# Patient Record
Sex: Female | Born: 1995 | Race: Black or African American | Hispanic: No | Marital: Single | Smoking: Never smoker
Health system: Southern US, Community
[De-identification: ages and names within clinical notes are randomized; demographics above are authoritative.]

## PROBLEM LIST (undated history)

## (undated) DIAGNOSIS — F32A Depression, unspecified: Secondary | ICD-10-CM

## (undated) DIAGNOSIS — F329 Major depressive disorder, single episode, unspecified: Secondary | ICD-10-CM

## (undated) DIAGNOSIS — D649 Anemia, unspecified: Secondary | ICD-10-CM

## (undated) DIAGNOSIS — F419 Anxiety disorder, unspecified: Secondary | ICD-10-CM

## (undated) HISTORY — DX: Depression, unspecified: F32.A

## (undated) HISTORY — DX: Major depressive disorder, single episode, unspecified: F32.9

## (undated) HISTORY — DX: Anemia, unspecified: D64.9

## (undated) HISTORY — PX: NO PAST SURGERIES: SHX2092

## (undated) HISTORY — DX: Anxiety disorder, unspecified: F41.9

---

## 2016-05-19 ENCOUNTER — Emergency Department (HOSPITAL_COMMUNITY): Payer: Self-pay

## 2016-05-19 ENCOUNTER — Emergency Department (HOSPITAL_COMMUNITY)
Admission: EM | Admit: 2016-05-19 | Discharge: 2016-05-19 | Disposition: A | Discharge status: Home / Self Care | Payer: Self-pay | Attending: Emergency Medicine | Admitting: Emergency Medicine

## 2016-05-19 ENCOUNTER — Encounter (HOSPITAL_COMMUNITY): Payer: Self-pay

## 2016-05-19 DIAGNOSIS — R0789 Other chest pain: Secondary | ICD-10-CM | POA: Insufficient documentation

## 2016-05-19 DIAGNOSIS — R079 Chest pain, unspecified: Secondary | ICD-10-CM

## 2016-05-19 MED ORDER — IBUPROFEN 200 MG PO TABS
600.0000 mg | ORAL_TABLET | Freq: Once | ORAL | Status: AC
  Administered 2016-05-19: 600 mg via ORAL
  Filled 2016-05-19: qty 1

## 2016-05-19 NOTE — ED Triage Notes (Signed)
Patient complains of sharp intermittent chest pain x 2-3 months. States that her pain is worse with inspiration and cough, states cough for several days. Patient appears to splint chest with movement, denies trauma.

## 2016-05-19 NOTE — ED Provider Notes (Signed)
[facility]EMER[facility]ENCY DEPT Provider Note   CSN: [id number] Arrival date & time: 05/19/16  1431     History   Chief Complaint Chief Complaint  Patient presents with  . Chest Pain    HPI Ruth Simon is a 20 y.o. female.  Patient presents today with a chief complaint of chest pain.  She reports that the pain has been occurring intermittently over the past 2-3 months.  Pain typically lasts a few minutes and then resolves.  Pain located in the sternum and does not radiate.  She states that the pain is brought on by certain movements and also with emotional stress.  No association with exertion.  Pain worsens with coughing.  She has not taken anything for pain.  No prior cardiac history.  No FH of cardiac disease or sudden onset of death at a young age for an unknown cause.  She denies any SOB, nausea, vomiting, LE edema, hemoptysis, dizziness, or syncope.  No history of DVT or PE.  She denies exogenous estrogen use.  No prolonged travel or surgeries in the past month.      History reviewed. No pertinent past medical history.  There are no active problems to display for this patient.   History reviewed. No pertinent surgical history.  OB History    No data available       Home Medications    Prior to Admission medications   Not on File    Family History No family history on file.  Social History Social History  Substance Use Topics  . Smoking status: Never Smoker  . Smokeless tobacco: Never Used  . Alcohol use No     Allergies   Review of patient's allergies indicates no known allergies.   Review of Systems Review of Systems  All other systems reviewed and are negative.    Physical Exam Updated Vital Signs BP 119/74 (BP Location: Left Arm)   Pulse 91   Temp 98.4 F (36.9 C) (Oral)   Resp 18   Ht 5\' 5"  (1.651 m)   Wt 61.1 kg   LMP 05/12/2016   SpO2 100%   BMI 22.43 kg/m   Physical Exam  Constitutional: She appears well-developed and well-nourished. No  distress.  HENT:  Head: Normocephalic and atraumatic.  Mouth/Throat: Oropharynx is clear and moist.  Neck: Normal range of motion. Neck supple.  Cardiovascular: Normal rate, regular rhythm and normal heart sounds.   Pulmonary/Chest: Effort normal and breath sounds normal. She exhibits tenderness.  Abdominal: Soft. Bowel sounds are normal. She exhibits no distension and no mass. There is no tenderness. There is no rebound and no guarding. No hernia.  Musculoskeletal: Normal range of motion.  No LE edema or erythema  Skin: Skin is warm and dry. She is not diaphoretic.  Psychiatric: She has a normal mood and affect.  Nursing note and vitals reviewed.    ED Treatments / Results  Labs (all labs ordered are listed, but only abnormal results are displayed) Labs Reviewed - No data to display  EKG  EKG Interpretation  Date/Time:  Sunday May 19 2016 14:40:14 EDT Ventricular Rate:  99 PR Interval:  144 QRS Duration: 86 QT Interval:  346 QTC Calculation: 444 R Axis:   66 Text Interpretation:  Normal sinus rhythm Normal ECG Confirmed by Adriana SimasOOK  MD, BRIAN ([id number]) on 05/19/2016 4:36:31 PM       Radiology Dg Chest 2 View  Result Date: 05/19/2016 CLINICAL DATA:  Chest pain EXAM: CHEST  2 VIEW COMPARISON:  None. FINDINGS: The heart size and mediastinal contours are within normal limits. Both lungs are clear. The visualized skeletal structures are unremarkable. IMPRESSION: No active cardiopulmonary disease. Electronically Signed   By: Gerome Samavid  Williams III M.D   On: 05/19/2016 15:30    Procedures Procedures (including critical care time)  Medications Ordered in ED Medications - No data to display   Initial Impression / Assessment and Plan / ED Course  I have reviewed the triage vital signs and the nursing notes.  Pertinent labs & imaging results that were available during my care of the patient were reviewed by me and considered in my medical decision making (see chart for  details).  Clinical Course      Final Clinical Impressions(s) / ED Diagnoses   Final diagnoses:  None   Patient is an 20 year old female who presents today with a chief complaint of chest pain.  Pain is atypical.  Worse with movements and emotional stress.  Normal EKG.  CXR is negative.  PERC negative.  Feel that the patient is stable for discharge.  Return precautions given.    New Prescriptions New Prescriptions   No medications on file     Santiago GladHeather Kyna Blahnik, PA-C 05/22/16 1258    Donnetta HutchingBrian Cook, MD 05/22/16 1314

## 2018-08-12 ENCOUNTER — Emergency Department (HOSPITAL_COMMUNITY): Payer: Federal, State, Local not specified - PPO

## 2018-08-12 ENCOUNTER — Emergency Department (HOSPITAL_COMMUNITY)
Admission: EM | Admit: 2018-08-12 | Discharge: 2018-08-12 | Disposition: A | Discharge status: Home / Self Care | Payer: Federal, State, Local not specified - PPO | Attending: Emergency Medicine | Admitting: Emergency Medicine

## 2018-08-12 ENCOUNTER — Encounter (HOSPITAL_COMMUNITY): Payer: Self-pay | Admitting: Emergency Medicine

## 2018-08-12 DIAGNOSIS — R0602 Shortness of breath: Secondary | ICD-10-CM | POA: Diagnosis not present

## 2018-08-12 DIAGNOSIS — R079 Chest pain, unspecified: Secondary | ICD-10-CM

## 2018-08-12 DIAGNOSIS — R05 Cough: Secondary | ICD-10-CM | POA: Insufficient documentation

## 2018-08-12 DIAGNOSIS — R0789 Other chest pain: Secondary | ICD-10-CM | POA: Insufficient documentation

## 2018-08-12 DIAGNOSIS — Z79899 Other long term (current) drug therapy: Secondary | ICD-10-CM | POA: Insufficient documentation

## 2018-08-12 LAB — BASIC METABOLIC PANEL
ANION GAP: 4 — AB (ref 5–15)
BUN: 12 mg/dL (ref 6–20)
CO2: 25 mmol/L (ref 22–32)
Calcium: 9.1 mg/dL (ref 8.9–10.3)
Chloride: 111 mmol/L (ref 98–111)
Creatinine, Ser: 0.97 mg/dL (ref 0.44–1.00)
GFR calc Af Amer: >60 mL/min (ref >60)
GFR calc non Af Amer: >60 mL/min (ref >60)
GLUCOSE: 82 mg/dL (ref 70–99)
POTASSIUM: 3.7 mmol/L (ref 3.5–5.1)
Sodium: 140 mmol/L (ref 135–145)

## 2018-08-12 LAB — CBC
HEMATOCRIT: 42.4 % (ref 36.0–46.0)
HEMOGLOBIN: 13.6 g/dL (ref 12.0–15.0)
MCH: 30.1 pg (ref 26.0–34.0)
MCHC: 32.1 g/dL (ref 30.0–36.0)
MCV: 93.8 fL (ref 80.0–100.0)
Platelets: 216 10*3/uL (ref 150–400)
RBC: 4.52 MIL/uL (ref 3.87–5.11)
RDW: 12.9 % (ref 11.5–15.5)
WBC: 8.1 10*3/uL (ref 4.0–10.5)
nRBC: 0 % (ref 0.0–0.2)

## 2018-08-12 LAB — D-DIMER, QUANTITATIVE (NOT AT ARMC)

## 2018-08-12 NOTE — ED Provider Notes (Signed)
Medical screening examination/treatment/procedure(s) were conducted as a shared visit with non-physician practitioner(s) and myself.  I personally evaluated the patient during the encounter.  Clinical Impression:   Final diagnoses:  Nonspecific chest pain   CP since 2017. Pressure / sharp - lasts minutes and goes away.  She does not take oral contraceptives but does have the implantable Nexplanon.  No other risk factors for pulmonary embolism or coronary syndrome. Today the pain is more constant Under the L breast - radiates to mid chest - has been seen by cardiologist - stress test neg.  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  On exam has mild tachycardia - no other acute fidnigns - no edema or asymetry.   Eber HongMiller, Jaeden Westbay, MD 08/12/18 [phone]

## 2018-08-12 NOTE — ED Notes (Signed)
Patient transported to X-ray 

## 2018-08-12 NOTE — ED Triage Notes (Signed)
Pt to ED with c/o pain in left chest under left breast.  Pt st's walking up steps makes it worse

## 2018-08-12 NOTE — Discharge Instructions (Addendum)
You have been seen today for chest pain. Please read and follow all provided instructions.  ° °1. Medications: usual home medications °2. Treatment: rest, drink plenty of fluids °3. Follow Up: Please follow up with your primary doctor in 7 days for discussion of your diagnoses and further evaluation after today's visit; if you do not have a primary care doctor use the resource guide provided to find one; Please return to the ER for any new or worsening symptoms.  ° °Take medications as prescribed. Return to the emergency room for worsening condition or new concerning symptoms. Follow up with your regular doctor. If you don't have a regular doctor use one of the numbers below to establish a primary care doctor. ° ° °Emergency Department Resource Guide °1) Find a Doctor and Pay Out of Pocket °Although you won't have to find out who is covered by your insurance plan, it is a good idea to ask around and get recommendations. You will then need to call the office and see if the doctor you have chosen will accept you as a new patient and what types of options they offer for patients who are self-pay. Some doctors offer discounts or will set up payment plans for their patients who do not have insurance, but you will need to ask so you aren't surprised when you get to your appointment. ° °2) Contact Your Local Health Department °Not all health departments have doctors that can see patients for sick visits, but many do, so it is worth a call to see if yours does. If you don't know where your local health department is, you can check in your phone book. The CDC also has a tool to help you locate your state's health department, and many state websites also have listings of all of their local health departments. ° °3) Find a Walk-in Clinic °If your illness is not likely to be very severe or complicated, you may want to try a walk in clinic. These are popping up all over the country in pharmacies, drugstores, and shopping  centers. They're usually staffed by nurse practitioners or physician assistants that have been trained to treat common illnesses and complaints. They're usually fairly quick and inexpensive. However, if you have serious medical issues or chronic medical problems, these are probably not your best option. ° °No Primary Care Doctor: °Call [facility] at  832-8000 - they can help you locate a primary care doctor that  accepts your insurance, provides certain services, etc. °Physician Referral Service- 1-800-533-3463 ° °Emergency Department Resource Guide °1) Find a Doctor and Pay Out of Pocket °Although you won't have to find out who is covered by your insurance plan, it is a good idea to ask around and get recommendations. You will then need to call the office and see if the doctor you have chosen will accept you as a new patient and what types of options they offer for patients who are self-pay. Some doctors offer discounts or will set up payment plans for their patients who do not have insurance, but you will need to ask so you aren't surprised when you get to your appointment. ° °2) Contact Your Local Health Department °Not all health departments have doctors that can see patients for sick visits, but many do, so it is worth a call to see if yours does. If you don't know where your local health department is, you can check in your phone book. The CDC also has a tool to help you locate   your state's health department, and many state websites also have listings of all of their local health departments. ° °3) Find a Walk-in Clinic °If your illness is not likely to be very severe or complicated, you may want to try a walk in clinic. These are popping up all over the country in pharmacies, drugstores, and shopping centers. They're usually staffed by nurse practitioners or physician assistants that have been trained to treat common illnesses and complaints. They're usually fairly quick and inexpensive. However, if you  have serious medical issues or chronic medical problems, these are probably not your best option. ° °No Primary Care Doctor: °Call [facility] at  832-8000 - they can help you locate a primary care doctor that  accepts your insurance, provides certain services, etc. °Physician Referral Service- 1-800-533-3463 ° °Chronic Pain Problems: °Organization         Address  Phone   Notes  °[facility]  ([phone]) [phone] Patients need to be referred by their primary care doctor.  ° °Medication Assistance: °Organization         Address  Phone   Notes  °[place] [facility] [address] °[place], [place] [address] ([phone]) 641-8030 --Must be a resident of [place] °-- Must have NO insurance coverage whatsoever (no Medicaid/ Medicare, etc.) °-- The pt. MUST have a primary care doctor that directs their care regularly and follows them in the community °  °[facility]  (866) 331-1348   °[employer]  (888) 892-1162   ° °Agencies that provide inexpensive medical care: °Organization         Address  Phone   Notes  °[facility]  ([phone]) [phone]   °[facility]    (336) 832-7272   °[facility] [address] °[place], [place] [address] (336) 832-4777   °[facility] [address], °[place] (336) 271-4999   °[facility]    (336) 373-0678   °[facility]    (336) 272-1050   °[facility] ° [address], [place] Phone:  (336) 832-4444, Fax:  (336) [phone] Hours of Operation:  9 am - 6 pm, M-F.  Also accepts Medicaid/Medicare and self-pay.  °[facility] ° [address], [place] Phone: (336) [phone], Fax: (336) [phone]. Hours of Operation:  8:30 am - 5:30 pm, M-F.  Also accepts Medicaid and self-pay.  °[facility] [place] [address], [place] Phone: (336) [phone]   °[facility] [address],  [place], [place] (336)723-1848, Ext. 123 Mondays & Thursdays: 7-9 AM.  First 15 patients are seen on a first come, first serve basis. °  ° °Medicaid-accepting [place] Providers: ° °Organization         Address  Phone   Notes  °[facility] [address], [place] ([phone]) [phone] Also accepts self-pay patients.  °[facility] [address], [place] ° (336) [phone]   °[facility] [address], [place] (336) [phone]   °[facility] [address] [place] [address], [place] (336) [phone]   °[facility] [address],   ° (336) [phone] Only accepts [place] Access Medicaid patients after they have their name applied to their card.  ° °Self-Pay (no insurance) in [place]: ° °Organization           Address  Phone   Notes  °Sickle Cell Patients, [facility] [address], [place] (336) [phone]   °[facility] [address], [place] (336) [phone]   °[facility] ° [address], [place] (336) [phone]   °[facility]/Dr. Osei-Bonsu ° [address] [place] [address], [place] or [address], [place] ([phone]) [phone] Phone number for both [place] and [place] locations is the same.  °[facility] [address], [place] (336) [phone]   °[facility] [address] [place] [address], [place] or [address] (336) [phone] °(336) [phone]   °[facility] [address],  (336) 350-1642, phone; (336) [phone], fax Sees patients 1st and 3rd Saturday of every month.  Must not qualify for public or private insurance (i.e. Medicaid, Medicare,  Health Choice, Veterans' Benefits)  Household income should be no more than 200% of the poverty level The clinic cannot treat you if you are pregnant or think you are pregnant  Sexually  transmitted diseases are not treated at the clinic.  °  ° °

## 2018-08-12 NOTE — ED Notes (Signed)
Discharge instructions discussed with Pt. Pt verbalized understanding. Pt stable and ambulatory.    

## 2018-08-12 NOTE — ED Provider Notes (Signed)
[facility]   CSN: [id number] Arrival date & time: 08/12/18  2007     History   Chief Complaint Chief Complaint  Patient presents with  . Chest Pain    HPI Ruth Simon is a 22 y.o. female presenting with chest pain onset today at 5pm. Patient reports she woke up from a nap with the chest pain. Patient describes pain as a constant sharp cramp and states walking and moving makes the pain worse. Patient states the location of the pain is under her left breast and travels to the middle of her chest and her right shoulder. Patient states she has had this pain since 2017, but states it has worsened in the last 6 months. Patient presents today because chest pain is typically intermittent and last a few minutes, but the pain has been constant today. Patient denies taking any medications today, but states she typically takes ibuprofen with partial relief. Patient has been evaluated by a cardiologist and has had a negative stress test. Patient denies knowing the name or location of the cardiologist. Patient reports she is supposed to have a CTA tomorrow to rule out a blood clot. Patient denies contraceptive use, leg swelling/pain, recent surgery, prolonged travel, or being sedentary. Patient reports she is adopted and does not know her family's medical history. Patient reports she feels associated shortness of breath and coughing with the chest pain. Patient denies recent trauma, fever, chills, night sweats, hemoptysis, or syncope. Patient denies drug use. Patient was seen on 04/2016 with similar symptoms.   HPI  History reviewed. No pertinent past medical history.  There are no active problems to display for this patient.   History reviewed. No pertinent surgical history.   OB History   None      Home Medications    Prior to Admission medications   Medication Sig Start Date End Date Taking? Authorizing Provider  citalopram (CELEXA) 20  MG tablet Take 20 mg by mouth daily.   Yes [provider]    Family History History reviewed. No pertinent family history.  Social History Social History   Tobacco Use  . Smoking status: Never Smoker  . Smokeless tobacco: Never Used  Substance Use Topics  . Alcohol use: No  . Drug use: Never     Allergies   Patient has no known allergies.   Review of Systems Review of Systems  Constitutional: Negative for activity change, appetite change, chills, diaphoresis, fatigue, fever and unexpected weight change.  HENT: Negative for congestion and rhinorrhea.   Respiratory: Positive for cough and shortness of breath. Negative for chest tightness and wheezing.   Cardiovascular: Positive for chest pain. Negative for palpitations and leg swelling.  Gastrointestinal: Negative for abdominal pain, nausea and vomiting.  Endocrine: Negative for cold intolerance and heat intolerance.  Musculoskeletal: Negative for back pain.  Skin: Negative for rash.  Allergic/Immunologic: Negative for immunocompromised state.  Neurological: Negative for dizziness, syncope, weakness and light-headedness.  Psychiatric/Behavioral: Negative for agitation. The patient is not nervous/anxious.      Physical Exam Updated Vital Signs BP 125/70   Pulse 91   Temp 98 F (36.7 C) (Oral)   Resp 17   Ht 5\' 7"  (1.702 m)   Wt 69.9 kg   LMP 07/12/2018   SpO2 100%   BMI 24.12 kg/m   Physical Exam  Constitutional: She appears well-developed and well-nourished. No distress.  HENT:  Head: Normocephalic and atraumatic.  Neck: Normal range of motion.  Neck supple. No JVD present.  Cardiovascular: Normal rate, regular rhythm, normal heart sounds and normal pulses. Exam reveals no gallop and no friction rub.  No murmur heard. Pulses:      Radial pulses are 2+ on the right side, and 2+ on the left side.       Dorsalis pedis pulses are 2+ on the right side, and 2+ on the left side.  Pulmonary/Chest: Effort  normal and breath sounds normal. No respiratory distress. She has no wheezes. She has no rales. She exhibits no tenderness.  Patient has tenderness upon palpation of middle of chest and under her left breast. No rashes, bruising, or other skin changes noted.   Abdominal: Soft. There is no tenderness.  Musculoskeletal: Normal range of motion.  Neurological: She is alert.  Skin: Capillary refill takes less than 2 seconds. No rash noted. She is not diaphoretic. No pallor.  Psychiatric: She has a normal mood and affect.  Nursing note and vitals reviewed.    ED Treatments / Results  Labs (all labs ordered are listed, but only abnormal results are displayed) Labs Reviewed  BASIC METABOLIC PANEL - Abnormal; Notable for the following components:      Result Value   Anion gap 4 (*)    All other components within normal limits  D-DIMER, QUANTITATIVE (NOT AT [facility])  CBC    EKG None  Radiology Dg Chest 2 View  Result Date: 08/12/2018 CLINICAL DATA:  Acute onset left-sided chest pain and shortness of breath today. EXAM: CHEST - 2 VIEW COMPARISON:  05/19/2016 FINDINGS: The heart size and mediastinal contours are within normal limits. Both lungs are clear. The visualized skeletal structures are unremarkable. IMPRESSION: Negative.  No active cardiopulmonary disease. Electronically Signed   By: Myles Rosenthal M.D.   On: 08/12/2018 21:38    Procedures Procedures (including critical care time)  Medications Ordered in ED Medications - No data to display   Initial Impression / Assessment and Plan / ED Course  I have reviewed the triage vital signs and the nursing notes.  Pertinent labs & imaging results that were available during my care of the patient were reviewed by me and considered in my medical decision making (see chart for details).  Clinical Course as of Aug 12 2300  Wed Aug 12, 2018  2246 Basic metabolic panel(!) [AH]  2247 D-dimer, quantitative [AH]  2247 CBC and BMP are  unremarkable.   CBC [AH]  2247 D dimer is negative.   D-dimer, quantitative [AH]    Clinical Course User Index [AH] Leretha Dykes, PA-C    Suspect symptoms may be due nonspecific chest pain. Ordered D dimer, CBC, and CMP to assess symptoms and rule out a PE. Patient does not appear to have risk factors for a PE. Advised patient to continue to follow up with cardiologist. Patient is to be discharged with recommendation to follow up with PCP in regards to today's hospital visit. Chest pain is not likely of cardiac or pulmonary etiology d/t presentation, VSS, no tracheal deviation, no JVD or new murmur, RRR, breath sounds equal bilaterally, EKG without acute abnormalities, and negative CXR. Pt has been advised to return to the ED if CP becomes exertional, associated with diaphoresis or nausea, radiates to left jaw/arm, worsens or becomes concerning in any way. Pt appears reliable for follow up and is agreeable to discharge.   Case has been discussed with and seen by Dr. Hyacinth Meeker who agrees with the above plan to discharge.  Final Clinical Impressions(s) / ED Diagnoses   Final diagnoses:  Nonspecific chest pain    ED Discharge Orders    None       Leretha Dykes, PA-C 08/12/18 2301    Eber Hong, MD 08/12/18 2314

## 2018-10-29 ENCOUNTER — Other Ambulatory Visit: Payer: Self-pay

## 2018-10-29 ENCOUNTER — Encounter: Payer: Self-pay | Admitting: Internal Medicine

## 2018-10-29 ENCOUNTER — Ambulatory Visit (INDEPENDENT_AMBULATORY_CARE_PROVIDER_SITE_OTHER): Payer: Federal, State, Local not specified - PPO | Admitting: Internal Medicine

## 2018-10-29 VITALS — BP 118/60 | HR 87 | Temp 98.0°F | Ht 65.4 in | Wt 159.4 lb

## 2018-10-29 DIAGNOSIS — R0789 Other chest pain: Secondary | ICD-10-CM

## 2018-10-29 DIAGNOSIS — F32A Anxiety disorder, unspecified: Secondary | ICD-10-CM | POA: Insufficient documentation

## 2018-10-29 DIAGNOSIS — R002 Palpitations: Secondary | ICD-10-CM | POA: Insufficient documentation

## 2018-10-29 DIAGNOSIS — R55 Syncope and collapse: Secondary | ICD-10-CM

## 2018-10-29 DIAGNOSIS — F419 Anxiety disorder, unspecified: Secondary | ICD-10-CM | POA: Diagnosis not present

## 2018-10-29 DIAGNOSIS — M940 Chondrocostal junction syndrome [Tietze]: Secondary | ICD-10-CM

## 2018-10-29 DIAGNOSIS — F3289 Other specified depressive episodes: Secondary | ICD-10-CM

## 2018-10-29 DIAGNOSIS — F329 Major depressive disorder, single episode, unspecified: Secondary | ICD-10-CM | POA: Insufficient documentation

## 2018-10-29 DIAGNOSIS — R5383 Other fatigue: Secondary | ICD-10-CM | POA: Diagnosis not present

## 2018-10-29 NOTE — Progress Notes (Signed)
Subjective:     Patient ID: Ruth Simon , female    DOB: 01/20/1996 , 23 y.o.   MRN: [id number]   Chief Complaint  Patient presents with  . New Patient (Initial Visit)  . Chest Pain    HPI  Pt is here to establish care with our practice. Does not have any chronic conditions. She has been in the ER  In November for recurrent chest pain sent there by her Cardiologist. She does not think is associated with eating.  There she was not given a trial of GI cocktail She has been having a different type of chest pain in the past than recent,  when she was in high school and Freshman year of college. CP is described as sharp and would feel like her heart "folded" and her heart would start racing and she would get SOB. This episode last 2-3 minutes, but when she went to ER lasted 10 minutes. After this episodes she has episodes of dry cough which could last up to 5 min after. Denies rhinitis or PND during those time unless she was crying for the pain. She has also had sharp pains in her chest) under L breast or L upper chest) when she is not anxious.   Three weeks ago while at her office she stood up and felt light headed and fainted, her boss found her " like she was sleeping on the floor" and pt does not recall this part, and pt recalls hearing her voice who was waking her up asking her if she was sleepy, and she sent her home. Pt did not hit her head and did not seek care since then. She really did think much about it.  LMP- 12/24-31, is sexually active and also has partner use condoms.   PMhx- depression/ anxiety   Family History  Adopted: Yes  Problem Relation Age of Onset  . Hypertension Mother      Current Outpatient Medications:  .  citalopram (CELEXA) 20 MG tablet, Take 20 mg by mouth daily., Disp: , Rfl:    No Known Allergies   Review of Systems  Constitutional: Positive for appetite change. Negative for unexpected weight change.       She is eating less often but eats more  amounts, and has not been feeling hungry so has to remind herself to eat  Eyes: Negative for visual disturbance.  Respiratory: Negative for cough and shortness of breath.   Cardiovascular: Positive for chest pain. Negative for palpitations and leg swelling.       Onset of Pressure on L upper chest which lasted 5 min and was mild. Then 4 days ago came back but was more intese and resolved, and the next 2 days had it again. Tuesday lasted 5 min and would occur intermittently. Then yesterday was there all day and was constant pressure plus had lower abd cramping pain from menses.   Gastrointestinal: Positive for abdominal pain. Negative for constipation, diarrhea, nausea and vomiting.       Has nausea yesterday  Endocrine: Positive for polydipsia. Negative for cold intolerance, heat intolerance and polyphagia.       Feels hot a lot even when her fan is on  Genitourinary: Negative for dysuria, frequency, hematuria and urgency.  Musculoskeletal: Negative.   Skin: Negative for rash.       Has noticed increased hair loss, and denies dry skin more than her usual during the [place].   Allergic/Immunologic: Negative for environmental allergies and food allergies.  Neurological: Negative for dizziness, light-headedness, numbness and headaches.  Psychiatric/Behavioral: Positive for sleep disturbance.       Gets 5-6 h, and when she wakes up sometimes after 2-3 h cant fall back asleep     Today's Vitals   10/29/18 0855  BP: 118/60  Pulse: 87  Temp: 98 F (36.7 C)  TempSrc: Oral  SpO2: 97%  Weight: 159 lb 6.4 oz (72.3 kg)  Height: 5' 5.4" (1.661 m)   Body mass index is 26.2 kg/m.   Objective:  Physical Exam  BP 118/60 (BP Location: Left Arm, Patient Position: Sitting, Cuff Size: Normal)   Pulse 87   Temp 98 F (36.7 C) (Oral)   Ht 5' 5.4" (1.661 m)   Wt 159 lb 6.4 oz (72.3 kg)   LMP  (LMP Unknown)   SpO2 97%   BMI 26.20 kg/m   General Appearance:    Alert, cooperative, no distress,  appears stated age  Head:    Normocephalic, without obvious abnormality, atraumatic  Eyes:    PERRL, conjunctiva/corneas clear, EOM's intact, fundi    benign, both eyes  Ears:    Normal TM's and external ear canals, both ears  Nose:   Nares normal, septum midline, mucosa normal, no drainage    or sinus tenderness  Throat:   Lips, mucosa, and tongue normal; teeth and gums normal  Neck:   Supple, symmetrical, trachea midline, no adenopathy;    thyroid:  no enlargement/tenderness/nodules.  Back:     Symmetric, no curvature, ROM normal, no CVA tenderness  Lungs:     Clear to auscultation bilaterally, respirations unlabored  Chest Wall:    Is tender on L upper chest and under L breast on rib region. No masses or crepitations noted.    Heart:    Regular rate and rhythm, S1 and S2 normal, no murmur, rub   or gallop     Abdomen:     Soft, non-tender, bowel sounds active all four quadrants,    no masses, no organomegaly        Extremities:   Extremities normal, atraumatic, no cyanosis or edema  Pulses:   2+ and symmetric all extremities  Skin:   Skin color, texture, turgor normal, no rashes or lesions  Lymph nodes:   Cervical, supraclavicular, and axillary nodes normal  Neurologic:   CNII-XII intact, normal strength, sensation and reflexes    Throughout. Normal Rhomberg, tandem gait, tip toe gait, heel and finger to nose.    Assessment And Plan:  1. Palpitations-intermittent associated with with chest pain. Advised to call her cardiologist and discuss current symptoms including syncope. I believe she needs a holter.  - Magnesium - TSH - T4, Free - Thyroid antibodies - Thyroid Peroxidase Antibody - CMP14 + Anion Gap  2. Other chest pain- chronic - CBC no Diff  3. Fatigue, unspecified type- chronic  4. Anxiety- chronic.May continue same meds, but if all labs are normal, may have to increase her Citalopram.   5. Other depression - stable with current med.   6. Costochondritis-  chronic. Advised to take Ibuprofen tid x 7 days.  7- Syncope- episodic. If all labs are normal and Holter is normal, and this re-occurs we will have to order a head scan.     FU next week for Fu of labs.   Gwenda Heiner RODRIGUEZ-SOUTHWORTH, PA-C

## 2018-10-30 LAB — CBC
HEMATOCRIT: 40.1 % (ref 34.0–46.6)
Hemoglobin: 13.1 g/dL (ref 11.1–15.9)
MCH: 30 pg (ref 26.6–33.0)
MCHC: 32.7 g/dL (ref 31.5–35.7)
MCV: 92 fL (ref 79–97)
PLATELETS: 233 10*3/uL (ref 150–450)
RBC: 4.37 x10E6/uL (ref 3.77–5.28)
RDW: 13.1 % (ref 11.7–15.4)
WBC: 6.1 10*3/uL (ref 3.4–10.8)

## 2018-10-30 LAB — CMP14 + ANION GAP
ALBUMIN: 3.8 g/dL — AB (ref 3.9–5.0)
ALK PHOS: 71 IU/L (ref 39–117)
ALT: 19 IU/L (ref 0–32)
AST: 19 IU/L (ref 0–40)
Albumin/Globulin Ratio: 1.2 (ref 1.2–2.2)
Anion Gap: 14 mmol/L (ref 10.0–18.0)
BILIRUBIN TOTAL: 0.3 mg/dL (ref 0.0–1.2)
BUN / CREAT RATIO: 7 — AB (ref 9–23)
BUN: 7 mg/dL (ref 6–20)
CHLORIDE: 102 mmol/L (ref 96–106)
CO2: 22 mmol/L (ref 20–29)
Calcium: 9.1 mg/dL (ref 8.7–10.2)
Creatinine, Ser: 0.96 mg/dL (ref 0.57–1.00)
GFR calc non Af Amer: 85 mL/min/{1.73_m2} (ref >59)
GFR, EST AFRICAN AMERICAN: 98 mL/min/{1.73_m2} (ref >59)
GLOBULIN, TOTAL: 3.1 g/dL (ref 1.5–4.5)
Glucose: 87 mg/dL (ref 65–99)
Potassium: 4.3 mmol/L (ref 3.5–5.2)
SODIUM: 138 mmol/L (ref 134–144)
TOTAL PROTEIN: 6.9 g/dL (ref 6.0–8.5)

## 2018-10-30 LAB — TSH: TSH: 1.51 u[IU]/mL (ref 0.450–4.500)

## 2018-10-30 LAB — MAGNESIUM: MAGNESIUM: 1.9 mg/dL (ref 1.6–2.3)

## 2018-10-30 LAB — THYROID ANTIBODIES: Thyroperoxidase Ab SerPl-aCnc: 13 IU/mL (ref 0–34)

## 2018-10-30 LAB — T4, FREE: Free T4: 1.08 ng/dL (ref 0.82–1.77)

## 2018-11-04 ENCOUNTER — Ambulatory Visit: Payer: Federal, State, Local not specified - PPO | Admitting: Internal Medicine

## 2020-07-04 IMAGING — DX DG CHEST 2V
2 series · 2 of 2 positions shown · non-contrast
Comparison: 05/19/2016

CLINICAL DATA: Acute onset left-sided chest pain and shortness of
breath today.

EXAM:
CHEST - 2 VIEW

[chest pa]
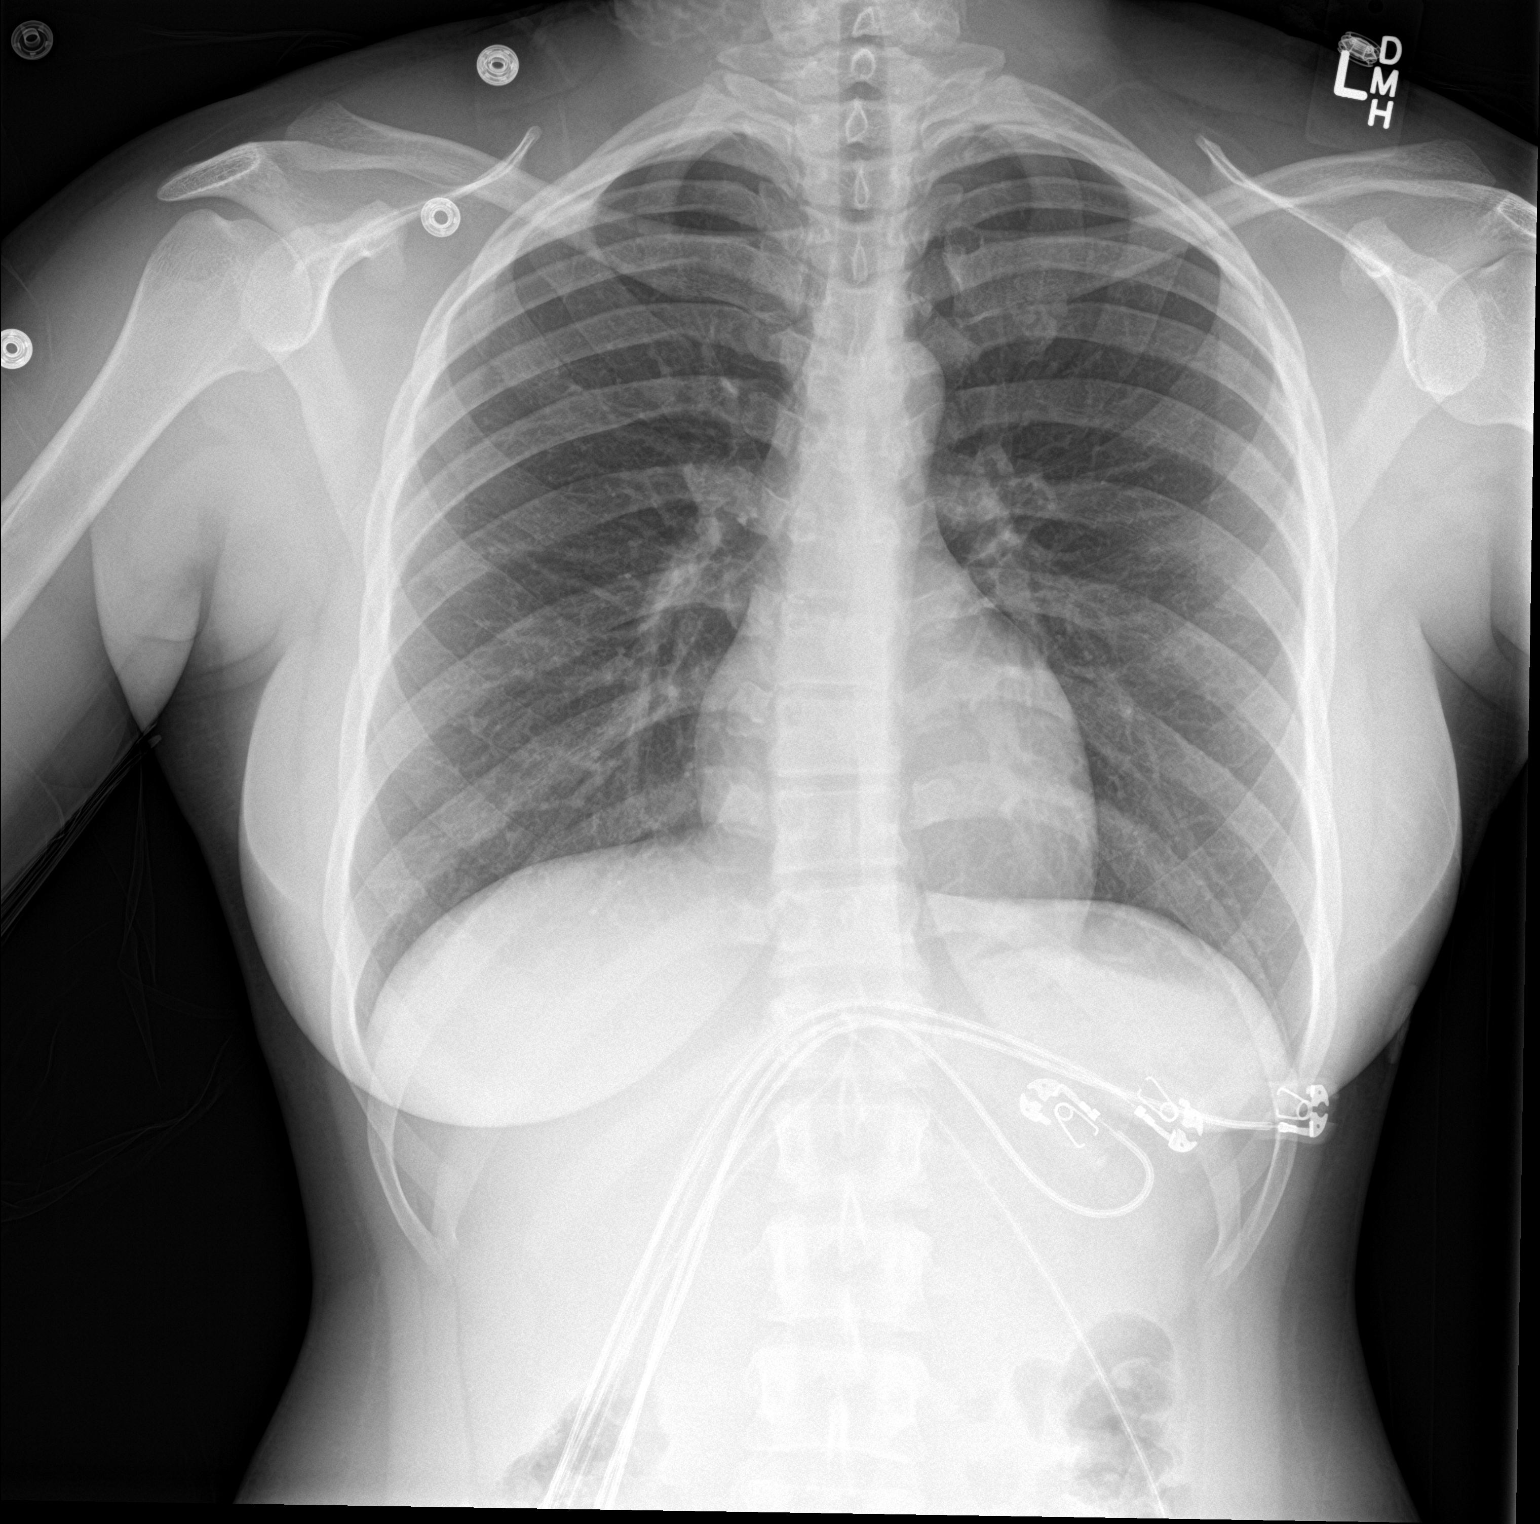

[chest lat]
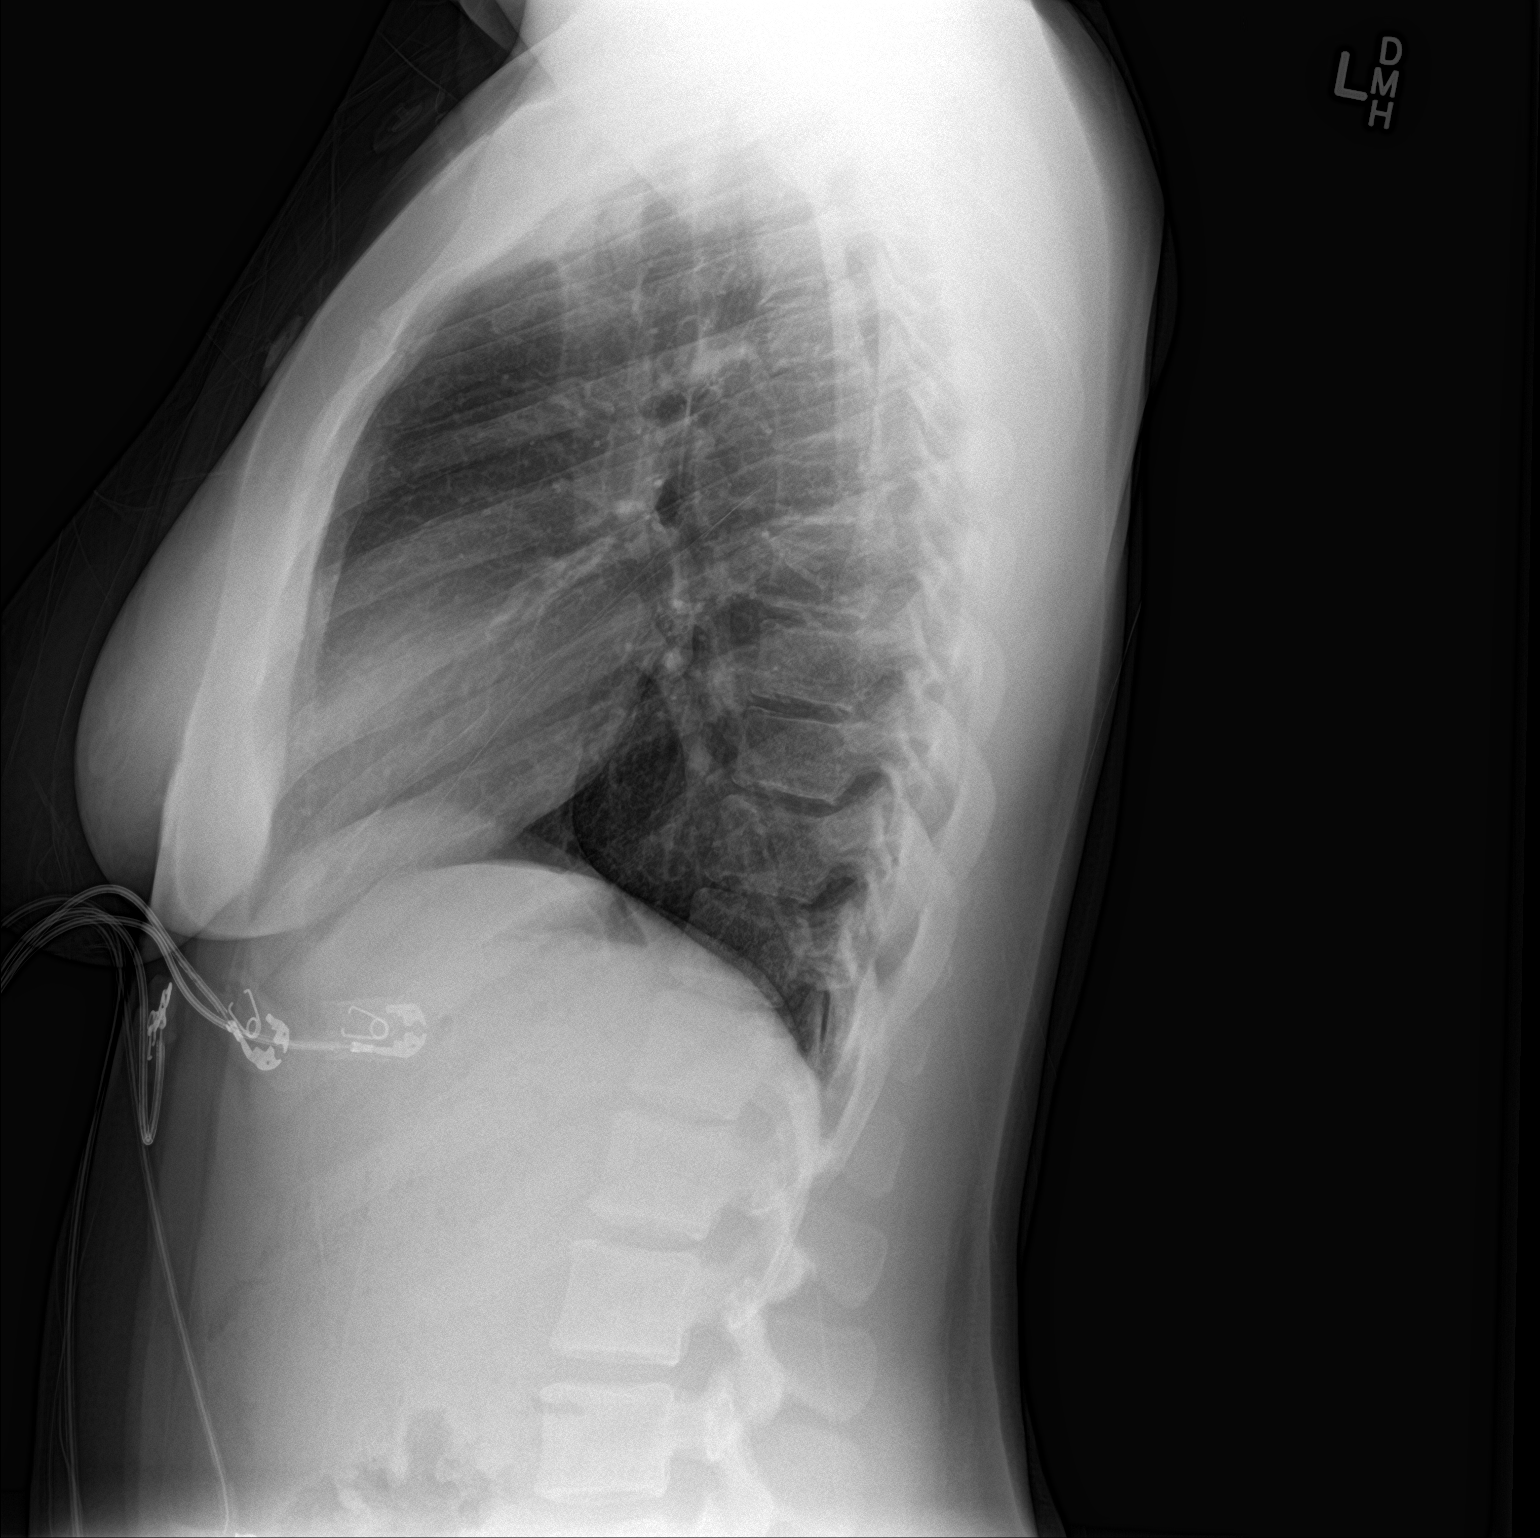

[2 of 2 positions shown; findings below may reference images not displayed]

FINDINGS: The heart size and mediastinal contours are within normal limits.
Both lungs are clear. The visualized skeletal structures are
unremarkable.
IMPRESSION: Negative.  No active cardiopulmonary disease.

## 2020-09-26 ENCOUNTER — Other Ambulatory Visit: Payer: Self-pay

## 2020-09-26 ENCOUNTER — Ambulatory Visit (HOSPITAL_COMMUNITY)
Admission: EM | Admit: 2020-09-26 | Discharge: 2020-09-26 | Disposition: A | Discharge status: Home / Self Care | Payer: Federal, State, Local not specified - PPO | Attending: Student | Admitting: Student

## 2020-09-26 ENCOUNTER — Encounter (HOSPITAL_COMMUNITY): Payer: Self-pay

## 2020-09-26 DIAGNOSIS — U071 COVID-19: Secondary | ICD-10-CM | POA: Insufficient documentation

## 2020-09-26 DIAGNOSIS — M940 Chondrocostal junction syndrome [Tietze]: Secondary | ICD-10-CM | POA: Diagnosis not present

## 2020-09-26 DIAGNOSIS — F419 Anxiety disorder, unspecified: Secondary | ICD-10-CM | POA: Insufficient documentation

## 2020-09-26 DIAGNOSIS — F32A Depression, unspecified: Secondary | ICD-10-CM | POA: Insufficient documentation

## 2020-09-26 DIAGNOSIS — Z79899 Other long term (current) drug therapy: Secondary | ICD-10-CM | POA: Diagnosis not present

## 2020-09-26 DIAGNOSIS — J069 Acute upper respiratory infection, unspecified: Secondary | ICD-10-CM

## 2020-09-26 DIAGNOSIS — R519 Headache, unspecified: Secondary | ICD-10-CM | POA: Diagnosis present

## 2020-09-26 MED ORDER — PROMETHAZINE-DM 6.25-15 MG/5ML PO SYRP: 5.0000 mL | ORAL_SOLUTION | Freq: Four times a day (QID) | ORAL | 0 refills | Status: DC | PRN

## 2020-09-26 MED ORDER — BENZONATATE 100 MG PO CAPS: 100.0000 mg | ORAL_CAPSULE | Freq: Three times a day (TID) | ORAL | 0 refills | Status: DC

## 2020-09-26 MED ORDER — ONDANSETRON 8 MG PO TBDP: 8.0000 mg | ORAL_TABLET | Freq: Three times a day (TID) | ORAL | 0 refills | Status: DC | PRN

## 2020-09-26 NOTE — ED Provider Notes (Signed)
[facility]    CSN: [id number] Arrival date & time: 09/26/20  1935      History   Chief Complaint Chief Complaint  Patient presents with  . Generalized Body Aches  . Emesis  . Headache    HPI Ruth Simon is a 24 y.o. female  presenting for body aches, sore throat, coughing, vomiting. History of anxiety, depression, palpitations, costochondritis. Today presents with 12 hours of symptoms. Has vomited twice today and is feeling naueous, but denies abd pain/ diarrhea. Is able to keep water down without issue but some difficulty with solid foods. Coughing frequently, occ productive. Denies fevers/chills, d, shortness of breath, chest pain, facial pain, teeth pain, headaches, sore throat, loss of taste/smell, swollen lymph nodes, ear pain. Fully vaccinated for covid19.   HPI  Past Medical History:  Diagnosis Date  . Anxiety   . Depression     Patient Active Problem List   Diagnosis Date Noted  . Anxiety 10/29/2018  . Other chest pain 10/29/2018  . Depression 10/29/2018  . Palpitations 10/29/2018  . Costochondritis 10/29/2018    History reviewed. No pertinent surgical history.  OB History   No obstetric history on file.      Home Medications    Prior to Admission medications   Medication Sig Start Date End Date Taking? Authorizing Provider  benzonatate (TESSALON) 100 MG capsule Take 1 capsule (100 mg total) by mouth every 8 (eight) hours. 09/26/20  Yes Rhys Martini, PA-C  ondansetron (ZOFRAN-ODT) 8 MG disintegrating tablet Take 1 tablet (8 mg total) by mouth every 8 (eight) hours as needed for nausea or vomiting. 09/26/20  Yes Rhys Martini, PA-C  promethazine-dextromethorphan (PROMETHAZINE-DM) 6.25-15 MG/5ML syrup Take 5 mLs by mouth 4 (four) times daily as needed for cough. 09/26/20  Yes Rhys Martini, PA-C  citalopram (CELEXA) 20 MG tablet Take 20 mg by mouth daily.    [provider]    Family History Family History  Adopted: Yes     Social History Social History   Tobacco Use  . Smoking status: Never Smoker  . Smokeless tobacco: Never Used  Vaping Use  . Vaping Use: Never used  Substance Use Topics  . Alcohol use: Yes    Comment: occasional  . Drug use: Never     Allergies   Patient has no known allergies.   Review of Systems Review of Systems  HENT: Positive for sore throat.   Respiratory: Positive for cough.   Gastrointestinal: Positive for nausea and vomiting.  All other systems reviewed and are negative.    Physical Exam Triage Vital Signs ED Triage Vitals  Enc Vitals Group     BP      Pulse      Resp      Temp      Temp src      SpO2      Weight      Height      Head Circumference      Peak Flow      Pain Score      Pain Loc      Pain Edu?      Excl. in GC?    No data found.  Updated Vital Signs BP 118/80 (BP Location: Left Arm)   Pulse (!) 109   Temp 99.2 F (37.3 C) (Oral)   Resp 20   LMP 09/08/2020   SpO2 100%   Visual Acuity Right Eye Distance:   Left Eye Distance:  Bilateral Distance:    Right Eye Near:   Left Eye Near:    Bilateral Near:     Physical Exam Vitals reviewed.  Constitutional:      General: She is not in acute distress.    Appearance: Normal appearance. She is not ill-appearing.  HENT:     Head: Normocephalic and atraumatic.     Right Ear: Hearing, tympanic membrane, ear canal and external ear normal. No swelling or tenderness. There is no impacted cerumen. No mastoid tenderness. Tympanic membrane is not perforated, erythematous, retracted or bulging.     Left Ear: Hearing, tympanic membrane, ear canal and external ear normal. No swelling or tenderness. There is no impacted cerumen. No mastoid tenderness. Tympanic membrane is not perforated, erythematous, retracted or bulging.     Nose:     Right Sinus: No maxillary sinus tenderness or frontal sinus tenderness.     Left Sinus: No maxillary sinus tenderness or frontal sinus tenderness.      Mouth/Throat:     Mouth: Mucous membranes are moist.     Pharynx: Uvula midline. No oropharyngeal exudate or posterior oropharyngeal erythema.     Tonsils: No tonsillar exudate.  Cardiovascular:     Rate and Rhythm: Normal rate and regular rhythm.     Heart sounds: Normal heart sounds.  Pulmonary:     Breath sounds: Normal breath sounds and air entry. No decreased breath sounds, wheezing, rhonchi or rales.     Comments: Coughing frequently Abdominal:     General: Abdomen is flat. Bowel sounds are normal.     Palpations: Abdomen is soft.     Tenderness: There is no abdominal tenderness. There is no right CVA tenderness, left CVA tenderness, guarding or rebound. Negative signs include Murphy's sign, Rovsing's sign and McBurney's sign.  Lymphadenopathy:     Cervical: No cervical adenopathy.  Neurological:     General: No focal deficit present.     Mental Status: She is alert and oriented to person, place, and time.  Psychiatric:        Attention and Perception: Attention and perception normal.        Mood and Affect: Mood and affect normal.        Behavior: Behavior is cooperative.      UC Treatments / Results  Labs (all labs ordered are listed, but only abnormal results are displayed) Labs Reviewed  RESP PANEL BY RT-PCR (FLU A&B, COVID) ARPGX2    EKG   Radiology No results found.  Procedures Procedures (including critical care time)  Medications Ordered in UC Medications - No data to display  Initial Impression / Assessment and Plan / UC Course  I have reviewed the triage vital signs and the nursing notes.  Pertinent labs & imaging results that were available during my care of the patient were reviewed by me and considered in my medical decision making (see chart for details).     Afebrile mildly tachy nontachypneic oxygenating well on room air.  Covid and influenza tests sent today. Patient is fully vaccinated for covid-19. Isolation precautions per CDC  guidelines until negative result. Symptomatic relief with OTC Mucinex, Nyquil, etc. Return precautions- new/worsening fevers/chills, shortness of breath, chest pain, abd pain, etc.   Symptomatic relief as below with tessalon, promethazine dm, zofran.   Final Clinical Impressions(s) / UC Diagnoses   Final diagnoses:  Acute upper respiratory infection     Discharge Instructions     -For your cough, use the tessalon and promethazine-dm cough syrup.  The cough syrup might make you drowsy, so take this at night.  -for your nausea, dissolve one pill of zofran in mouth as needed.  -drink plenty of fluids and eat a bland diet as tolerated.     ED Prescriptions    Medication Sig Dispense Auth. Provider   benzonatate (TESSALON) 100 MG capsule Take 1 capsule (100 mg total) by mouth every 8 (eight) hours. 21 capsule Rhys Martini, PA-C   promethazine-dextromethorphan (PROMETHAZINE-DM) 6.25-15 MG/5ML syrup Take 5 mLs by mouth 4 (four) times daily as needed for cough. 118 mL Ignacia Bayley E, PA-C   ondansetron (ZOFRAN-ODT) 8 MG disintegrating tablet Take 1 tablet (8 mg total) by mouth every 8 (eight) hours as needed for nausea or vomiting. 20 tablet Rhys Martini, PA-C     PDMP not reviewed this encounter.   Rhys Martini, PA-C 09/26/20 2100

## 2020-09-26 NOTE — Discharge Instructions (Addendum)
-  For your cough, use the tessalon and promethazine-dm cough syrup. The cough syrup might make you drowsy, so take this at night.  -for your nausea, dissolve one pill of zofran in mouth as needed.  -drink plenty of fluids and eat a bland diet as tolerated.

## 2020-09-26 NOTE — ED Triage Notes (Signed)
Pt presents with generalized body aches, vomiting, and headache since waking up this morning.

## 2020-09-27 LAB — RESP PANEL BY RT-PCR (FLU A&B, COVID) ARPGX2
Influenza A by PCR: NEGATIVE
Influenza B by PCR: NEGATIVE
SARS Coronavirus 2 by RT PCR: POSITIVE — AB

## 2020-10-27 ENCOUNTER — Ambulatory Visit: Payer: Federal, State, Local not specified - PPO | Admitting: Family

## 2020-11-24 ENCOUNTER — Ambulatory Visit (INDEPENDENT_AMBULATORY_CARE_PROVIDER_SITE_OTHER): Payer: BC Managed Care – PPO | Admitting: Family

## 2020-11-24 ENCOUNTER — Encounter: Payer: Self-pay | Admitting: Family

## 2020-11-24 ENCOUNTER — Other Ambulatory Visit: Payer: Self-pay

## 2020-11-24 VITALS — BP 113/74 | HR 75 | Ht 65.63 in | Wt 188.0 lb

## 2020-11-24 DIAGNOSIS — F32A Depression, unspecified: Secondary | ICD-10-CM

## 2020-11-24 DIAGNOSIS — Z7689 Persons encountering health services in other specified circumstances: Secondary | ICD-10-CM

## 2020-11-24 DIAGNOSIS — F419 Anxiety disorder, unspecified: Secondary | ICD-10-CM | POA: Diagnosis not present

## 2020-11-24 DIAGNOSIS — F909 Attention-deficit hyperactivity disorder, unspecified type: Secondary | ICD-10-CM

## 2020-11-24 MED ORDER — CITALOPRAM HYDROBROMIDE 20 MG PO TABS: 20.0000 mg | ORAL_TABLET | Freq: Every day | ORAL | 0 refills | Status: DC

## 2020-11-24 NOTE — Patient Instructions (Signed)
Return for annual physical examination, labs, and health maintenance. Arrive fasting meaning having had no food and/or nothing to drink for at least 8 hours prior to appointment.  Begin Celexa for anxiety and depression. Follow-up in 4 to 6 weeks.  Referral to [facility] [address] [place], [place] [address] Phone # [phone] Walk-Ins Welcome. Thank you for choosing Primary Care at [facility] for your medical home!    Ruth Simon was seen by Rema Fendt, NP today.   Fara Olden primary care provider is Amy Jodi Geralds, NP.   For the best care possible,  you should try to see Ricky Stabs, NP whenever you come to clinic.   We look forward to seeing you again soon!  If you have any questions about your visit today,  please call us at [phone]  Or feel free to reach your provider via MyChart.    Major Depressive Disorder, Adult Major depressive disorder is a mental health condition. This disorder affects feelings. It can also affect the body. Symptoms of this condition last most of the day, almost every day, for 2 weeks. This disorder can affect:  Relationships.  Daily activities, such as work and school.  Activities that you normally like to do. What are the causes? The cause of this condition is not known. The disorder is likely caused by a mix of things, including:  Your personality, such as being a shy person.  Your behavior, or how you act toward others.  Your thoughts and feelings.  Too much alcohol or drugs.  How you react to stress.  Health and mental problems that you have had for a long time.  Things that hurt you in the past (trauma).  Big changes in your life, such as divorce. What increases the risk? The following factors may make you more likely to develop this condition:  Having family members with depression.  Being a woman.  Problems in the family.  Low levels of some brain chemicals.  Things that  caused you pain as a child, especially if you lost a parent or were abused.  A lot of stress in your life, such as from: ? Living without basic needs of life, such as food and shelter. ? Being treated poorly because of race, sex, or religion (discrimination).  Health and mental problems that you have had for a long time. What are the signs or symptoms? The main symptoms of this condition are:  Being sad all the time.  Being grouchy all the time.  Loss of interest in things and activities. Other symptoms include:  Sleeping too much or too little.  Eating too much or too little.  Gaining or losing weight, without knowing why.  Feeling tired or having low energy.  Being restless and weak.  Feeling hopeless, worthless, or guilty.  Trouble thinking clearly or making decisions.  Thoughts of hurting yourself or others, or thoughts of ending your life.  Spending a lot of time alone.  Inability to complete common tasks of daily life. If you have very bad MDD, you may:  Believe things that are not true.  Hear, see, taste, or feel things that are not there.  Have mild depression that lasts for at least 2 years.  Feel very sad and hopeless.  Have trouble speaking or moving. How is this treated? This condition may be treated with:  Talk therapy. This teaches you to know bad thoughts, feelings, and actions and how to  change them. ? This can also help you to communicate with others. ? This can be done with members of your family.  Medicines. These can be used to treat worry (anxiety), depression, or low levels of chemicals in the brain.  Lifestyle changes. You may need to: ? Limit alcohol use. ? Limit drug use. ? Get regular exercise. ? Get plenty of sleep. ? Make healthy eating choices. ? Spend more time outdoors.  Brain stimulation. This treatment excites the brain. This is done when symptoms are very bad or have not gotten better with other treatments. Follow  these instructions at home: Activity  Get regular exercise as told.  Spend time outdoors as told.  Make time to do the things you enjoy.  Find ways to deal with stress. Try to: ? Meditate. ? Do deep breathing. ? Spend time in nature. ? Keep a journal.  Return to your normal activities as told by your doctor. Ask your doctor what activities are safe for you. Alcohol and drug use  If you drink alcohol: ? Limit how much you use to:  0-1 drink a day for women.  0-2 drinks a day for men. ? Be aware of how much alcohol is in your drink. In the [place] one drink equals one 12 oz bottle of beer (355 mL), one 5 oz glass of wine (148 mL), or one 1 oz glass of hard liquor (44 mL).  Talk to your doctor about: ? Alcohol use. Alcohol can affect some medicines. ? Any drug use. General instructions  Take over-the-counter and prescription medicines and herbal preparations only as told by your doctor.  Eat a healthy diet.  Get a lot of sleep.  Think about joining a support group. Your doctor may be able to suggest one.  Keep all follow-up visits as told by your doctor. This is important.   Where to find more information:  [employer] on Mental Illness: www.nami.org  [place] [employer] of Mental Health: http://www.maynard.net/  American Psychiatric Association: www.psychiatry.org/patients-families/ Contact a doctor if:  Your symptoms get worse.  You get new symptoms. Get help right away if:  You hurt yourself.  You have serious thoughts about hurting yourself or others.  You see, hear, taste, smell, or feel things that are not there. If you ever feel like you may hurt yourself or others, or have thoughts about taking your own life, get help right away. Go to your nearest emergency department or:  Call your local emergency services (911 in the [place]  Call a suicide crisis helpline, such as the National Suicide Prevention Lifeline at 778 850 0239. This is open 24 hours  a day in the [place]  Text the Crisis Text Line at (913)228-3519 (in the [place] Summary  Major depressive disorder is a mental health condition. This disorder affects feelings. Symptoms of this condition last most of the day, almost every day, for 2 weeks.  The symptoms of this disorder can cause problems with relationships and with daily activities.  There are treatments and support for people who get this disorder. You may need more than one type of treatment.  Get help right away if you have serious thoughts about hurting yourself or others. This information is not intended to replace advice given to you by your health care provider. Make sure you discuss any questions you have with your health care provider. Document Revised: 08/28/2019 Document Reviewed: 08/28/2019 Elsevier Patient Education  2021 [employer].

## 2020-11-24 NOTE — Progress Notes (Signed)
Subjective:    Ruth Simon - 25 y.o. female MRN [id number]  Date of birth: [birth year]  HPI  Ruth Simon is to establish care. Patient has a PMH significant for costochondritis, anxiety, chest pain, depression, and palpitations.   Current issues and/or concerns: 1. ANXIETY: Reports since sophomore year of college she was prescribed Celexa to assist with anxiety. Has been at least 2 to 3 years since last taken.attempted to call for refills but feels that her moving from [place], [place] to [place] may have contributed to the loss of prescription refills. Describes feeling anxious, easily irritated, and on edge. Denies thoughts of self-harm, suicidal ideations, and homicidal ideations.   Duration:uncontrolled Anxious mood: yes  Excessive worrying: yes Irritability: yes  Sweating: no Nausea: no Palpitations:no Hyperventilation: no Panic attacks: sometimes Depressed mood: yes Depression screen [facility] 2/9 11/24/2020 10/29/2018  Decreased Interest 2 0  Down, Depressed, Hopeless 3 1  PHQ - 2 Score 5 1  Altered sleeping 2 -  Tired, decreased energy 3 -  Change in appetite 3 -  Feeling bad or failure about yourself  3 -  Trouble concentrating 3 -  Moving slowly or fidgety/restless 3 -  Suicidal thoughts 2 -  PHQ-9 Score 24 -   Insomnia: yes  Fatigue/loss of energy: yes  Feelings of worthlessness: yes  Feelings of guilt: yes  Impaired concentration/indecisiveness: yes Suicidal ideations: no  Crying spells: sometimes  Recent Stressors/Life Changes: yes   Relationship problems: yes, denies domestic abuse    Family stress: Yes, says that she feels as if she is holding her family back from what they really want to do because they are worried about if she is going to be ok. Says she hears a voice inside of her head that she needs to start medication so that she will feel better.    Financial stress: no Yes    Job stress: no    Recent death/loss: no  2. ADHD: Requesting to begin  ADHD medication. Describes easily forgetting things, talking too much, amd brain constantly running. Says she was diagnosed during childhood and started on Ritalin. Says her mother discontinued the medication because she didn't seem like herself.   Current issues and/or concerns:  ROS per HPI   Health Maintenance:  Health Maintenance Due  Topic Date Due  . Hepatitis C Screening  Never done  . COVID-19 Vaccine (1) Never done  . HIV Screening  Never done  . TETANUS/TDAP  Never done  . PAP-Cervical Cytology Screening  Never done  . PAP SMEAR-Modifier  Never done   Past Medical History: Patient Active Problem List   Diagnosis Date Noted  . Anxiety 10/29/2018  . Other chest pain 10/29/2018  . Depression 10/29/2018  . Palpitations 10/29/2018  . Costochondritis 10/29/2018    Social History   reports that she has never smoked. She has never used smokeless tobacco. She reports previous alcohol use. She reports that she does not use drugs.   Family History  Family history is unknown by patient.   Medications: reviewed and updated   Objective:   Physical Exam BP 113/74 (BP Location: Left Arm, Patient Position: Sitting)   Pulse 75   Ht 5' 5.63" (1.667 m)   Wt 188 lb (85.3 kg)   SpO2 96%   BMI 30.69 kg/m  Physical Exam HENT:     Head: Normocephalic and atraumatic.  Eyes:     Extraocular Movements: Extraocular movements intact.     Pupils: Pupils are equal, round, and  reactive to light.  Cardiovascular:     Rate and Rhythm: Normal rate and regular rhythm.     Pulses: Normal pulses.     Heart sounds: Normal heart sounds.  Pulmonary:     Effort: Pulmonary effort is normal.     Breath sounds: Normal breath sounds.  Musculoskeletal:     Cervical back: Normal range of motion and neck supple.  Neurological:     General: No focal deficit present.     Mental Status: She is alert and oriented to person, place, and time.  Psychiatric:        Mood and Affect: Mood normal.         Behavior: Behavior normal.       Assessment & Plan:  1. Encounter to establish care: - Patient presents today to establish care.  - Return for annual physical examination, labs, and health maintenance. Arrive fasting meaning having had no food and/or nothing to drink for at least 8 hours prior to appointment.  2. Anxiety: 3. Depression, unspecified depression type: - Chronic. - Stable.  - Denies thoughts of self-harm, suicidal ideations, and homicidal ideations. - Begin Citalopram as prescribed. Avoid driving or hazardous activity until you know how this medication will affect you. Your reactions could be impaired. Dizziness or fainting can cause falls, accidents, or severe injuries.  Common side effects include drowsiness, nausea, constipation, loss of appetite, dry mouth, increased sweating.  Call your doctor if you have pounding heartbeats or fluttering in your chest, a light-headed feeling like you may pass out, easy bruising/unusal bleeding, vision change, difficult or painful urination, impotence/sexual problems, liver problems (right-sided upper stomach pain, itching, dark urine, yellowing of skin or eyes/jaundice, low levels of sodium in the body (headache, confusion, slurred speech, severe weakness, vomiting, loss of coordination, feeling unsteady), or manic episodes (racing thoughts, increased energy, decreased need for sleep, risk-taking behavior, being agitated, talkative)  Seek medical attention immediately if you have symptoms of serotonin syndrome such as agitation, hallucinations, fever, sweating, shivering, fast heart rate, muscle stiffness, twitching, loss of coordination, nausea, vomiting, or diarrhea  Report any new or worsening symptoms to your doctor, such as: mood or behavior changes, anxiety, panic attacks, trouble sleeping, or if you feel impulsive, irritable, agitated, hostile, aggressive, restless, hyperactive (mentally or physically), more depressed, or have  thoughts about suicide or hurting yourself - Referral to Psychiatry at the [facility] for further evaluation and management.  - Follow-up with primary provider in 4 weeks or sooner if needed.  - citalopram (CELEXA) 20 MG tablet; Take 1 tablet (20 mg total) by mouth daily.  Dispense: 30 tablet; Refill: 0   4. Attention deficit hyperactivity disorder (ADHD), unspecified ADHD type: - Requesting to begin ADHD medication. Describes easily forgetting things, talking too much, amd brain constantly running. Says she was diagnosed during childhood and started on Ritalin. Says her mother discontinued the medication because she didn't seem like herself. - Referral to Psychiatry at the [facility] for further evaluation and management.  - Ambulatory referral to Psychiatry   Patient was given clear instructions to go to Emergency Department or return to medical center if symptoms don't improve, worsen, or new problems develop.The patient verbalized understanding.  I discussed the assessment and treatment plan with the patient. The patient was provided an opportunity to ask questions and all were answered. The patient agreed with the plan and demonstrated an understanding of the instructions.   The patient was advised to  call back or seek an in-person evaluation if the symptoms worsen or if the condition fails to improve as anticipated.    Ricky Stabs, NP 11/25/2020, 1:08 PM Primary Care at [facility]

## 2020-11-24 NOTE — Progress Notes (Signed)
Establish care Wants to start back on Celexa Pt feels she has undiagnosed ADHD PHQ-9=24

## 2020-12-22 ENCOUNTER — Other Ambulatory Visit: Payer: Self-pay

## 2020-12-22 ENCOUNTER — Ambulatory Visit (INDEPENDENT_AMBULATORY_CARE_PROVIDER_SITE_OTHER): Payer: Federal, State, Local not specified - PPO | Admitting: Family

## 2020-12-22 ENCOUNTER — Encounter: Payer: Self-pay | Admitting: Family

## 2020-12-22 VITALS — BP 101/67 | HR 78 | Ht 65.63 in | Wt 183.6 lb

## 2020-12-22 DIAGNOSIS — F419 Anxiety disorder, unspecified: Secondary | ICD-10-CM

## 2020-12-22 DIAGNOSIS — F32A Depression, unspecified: Secondary | ICD-10-CM

## 2020-12-22 MED ORDER — CITALOPRAM HYDROBROMIDE 20 MG PO TABS: 20.0000 mg | ORAL_TABLET | Freq: Every day | ORAL | 2 refills | Status: DC

## 2020-12-22 NOTE — Progress Notes (Signed)
Patient ID: Ruth Simon, female    DOB: 1996/09/24  MRN: [id number]  CC: Anxiety and Depression Follow-Up  Subjective: Ruth Simon is a 25 y.o. female who presents for anxiety and depression. Her concerns today include:   1. ANXIETY AND DEPRESSION FOLLOW-UP: 11/24/2020: - Begin Citalopram as prescribed.  12/22/2020: - Today reports doing well on current regimen. Reports better mood and has more enjoyment at her job. Denies thoughts of self-harm, suicidal ideations, and homicidal ideations.    Patient Active Problem List   Diagnosis Date Noted  . Anxiety 10/29/2018  . Other chest pain 10/29/2018  . Depression 10/29/2018  . Palpitations 10/29/2018  . Costochondritis 10/29/2018     No current outpatient medications on file prior to visit.   No current facility-administered medications on file prior to visit.    No Known Allergies  Social History   Socioeconomic History  . Marital status: Single    Spouse name: Not on file  . Number of children: Not on file  . Years of education: Not on file  . Highest education level: Not on file  Occupational History  . Not on file  Tobacco Use  . Smoking status: Never Smoker  . Smokeless tobacco: Never Used  Vaping Use  . Vaping Use: Never used  Substance and Sexual Activity  . Alcohol use: Not Currently    Comment: occasional  . Drug use: Never  . Sexual activity: Yes    Birth control/protection: Implant  Other Topics Concern  . Not on file  Social History Narrative  . Not on file   Social Determinants of Health   Financial Resource Strain: Not on file  Food Insecurity: Not on file  Transportation Needs: Not on file  Physical Activity: Not on file  Stress: Not on file  Social Connections: Not on file  Intimate Partner Violence: Not on file    Family History  Family history unknown: Yes    Past Surgical History:  Procedure Laterality Date  . NO PAST SURGERIES      ROS: Review of Systems Negative  except as stated above  PHYSICAL EXAM: BP 101/67 (BP Location: Right Arm, Patient Position: Sitting)   Pulse 78   Ht 5' 5.63" (1.667 m)   Wt 183 lb 9.6 oz (83.3 kg)   SpO2 94%   BMI 29.97 kg/m   Physical Exam HENT:     Head: Normocephalic and atraumatic.  Eyes:     Conjunctiva/sclera: Conjunctivae normal.     Pupils: Pupils are equal, round, and reactive to light.  Cardiovascular:     Rate and Rhythm: Normal rate and regular rhythm.     Pulses: Normal pulses.     Heart sounds: Normal heart sounds.  Pulmonary:     Effort: Pulmonary effort is normal.     Breath sounds: Normal breath sounds.  Musculoskeletal:     Cervical back: Normal range of motion and neck supple.  Neurological:     General: No focal deficit present.     Mental Status: She is alert and oriented to person, place, and time.  Psychiatric:        Mood and Affect: Mood normal.        Behavior: Behavior normal.      ASSESSMENT AND PLAN: 1. Anxiety: 2. Depression, unspecified depression type: - Stable.  - Denies thoughts of self-harm, suicidal ideations, and homicidal ideations.  - Continue Citalopram as prescribed.  - Follow-up with primary provider in 3 months or sooner if  needed.  - citalopram (CELEXA) 20 MG tablet; Take 1 tablet (20 mg total) by mouth daily.  Dispense: 30 tablet; Refill: 2    Patient was given the opportunity to ask questions.  Patient verbalized understanding of the plan and was able to repeat key elements of the plan. Patient was given clear instructions to go to Emergency Department or return to medical center if symptoms don't improve, worsen, or new problems develop.The patient verbalized understanding.   Requested Prescriptions   Signed Prescriptions Disp Refills  . citalopram (CELEXA) 20 MG tablet 30 tablet 2    Sig: Take 1 tablet (20 mg total) by mouth daily.    Return in about 3 months (around 03/24/2021) for Follow-Up anxiety and depression , Follow-Up.  Rema Fendt, NP

## 2020-12-22 NOTE — Patient Instructions (Signed)
Continue Citalopram for anxiety/depression.   Follow-up in 3 months or sooner if needed.  Generalized Anxiety Disorder, Adult Generalized anxiety disorder (GAD) is a mental health condition. Unlike normal worries, anxiety related to GAD is not triggered by a specific event. These worries do not fade or get better with time. GAD interferes with relationships, work, and school. GAD symptoms can vary from mild to severe. People with severe GAD can have intense waves of anxiety with physical symptoms that are similar to panic attacks. What are the causes? The exact cause of GAD is not known, but the following are believed to have an impact:  Differences in natural brain chemicals.  Genes passed down from parents to children.  Differences in the way threats are perceived.  Development during childhood.  Personality. What increases the risk? The following factors may make you more likely to develop this condition:  Being female.  Having a family history of anxiety disorders.  Being very shy.  Experiencing very stressful life events, such as the death of a loved one.  Having a very stressful family environment. What are the signs or symptoms? People with GAD often worry excessively about many things in their lives, such as their health and family. Symptoms may also include:  Mental and emotional symptoms: ? Worrying excessively about natural disasters. ? Fear of being late. ? Difficulty concentrating. ? Fears that others are judging your performance.  Physical symptoms: ? Fatigue. ? Headaches, muscle tension, muscle twitches, trembling, or feeling shaky. ? Feeling like your heart is pounding or beating very fast. ? Feeling out of breath or like you cannot take a deep breath. ? Having trouble falling asleep or staying asleep, or experiencing restlessness. ? Sweating. ? Nausea, diarrhea, or irritable bowel syndrome (IBS).  Behavioral symptoms: ? Experiencing erratic moods or  irritability. ? Avoidance of new situations. ? Avoidance of people. ? Extreme difficulty making decisions. How is this diagnosed? This condition is diagnosed based on your symptoms and medical history. You will also have a physical exam. Your health care provider may perform tests to rule out other possible causes of your symptoms. To be diagnosed with GAD, a person must have anxiety that:  Is out of his or her control.  Affects several different aspects of his or her life, such as work and relationships.  Causes distress that makes him or her unable to take part in normal activities.  Includes at least three symptoms of GAD, such as restlessness, fatigue, trouble concentrating, irritability, muscle tension, or sleep problems. Before your health care provider can confirm a diagnosis of GAD, these symptoms must be present more days than they are not, and they must last for 6 months or longer. How is this treated? This condition may be treated with:  Medicine. Antidepressant medicine is usually prescribed for long-term daily control. Anti-anxiety medicines may be added in severe cases, especially when panic attacks occur.  Talk therapy (psychotherapy). Certain types of talk therapy can be helpful in treating GAD by providing support, education, and guidance. Options include: ? Cognitive behavioral therapy (CBT). People learn coping skills and self-calming techniques to ease their physical symptoms. They learn to identify unrealistic thoughts and behaviors and to replace them with more appropriate thoughts and behaviors. ? Acceptance and commitment therapy (ACT). This treatment teaches people how to be mindful as a way to cope with unwanted thoughts and feelings. ? Biofeedback. This process trains you to manage your body's response (physiological response) through breathing techniques and relaxation methods.  You will work with a therapist while machines are used to monitor your physical  symptoms.  Stress management techniques. These include yoga, meditation, and exercise. A mental health specialist can help determine which treatment is best for you. Some people see improvement with one type of therapy. However, other people require a combination of therapies.   Follow these instructions at home: Lifestyle  Maintain a consistent routine and schedule.  Anticipate stressful situations. Create a plan, and allow extra time to work with your plan.  Practice stress management or self-calming techniques that you have learned from your therapist or your health care provider. General instructions  Take over-the-counter and prescription medicines only as told by your health care provider.  Understand that you are likely to have setbacks. Accept this and be kind to yourself as you persist to take better care of yourself.  Recognize and accept your accomplishments, even if you judge them as small.  Keep all follow-up visits as told by your health care provider. This is important. Contact a health care provider if:  Your symptoms do not get better.  Your symptoms get worse.  You have signs of depression, such as: ? A persistently sad or irritable mood. ? Loss of enjoyment in activities that used to bring you joy. ? Change in weight or eating. ? Changes in sleeping habits. ? Avoiding friends or family members. ? Loss of energy for normal tasks. ? Feelings of guilt or worthlessness. Get help right away if:  You have serious thoughts about hurting yourself or others. If you ever feel like you may hurt yourself or others, or have thoughts about taking your own life, get help right away. Go to your nearest emergency department or:  Call your local emergency services (911 in the [place]  Call a suicide crisis helpline, such as the National Suicide Prevention Lifeline at (641)042-0519. This is open 24 hours a day in the [place]  Text the Crisis Text Line at (731)278-9374 (in the  [place] Summary  Generalized anxiety disorder (GAD) is a mental health condition that involves worry that is not triggered by a specific event.  People with GAD often worry excessively about many things in their lives, such as their health and family.  GAD may cause symptoms such as restlessness, trouble concentrating, sleep problems, frequent sweating, nausea, diarrhea, headaches, and trembling or muscle twitching.  A mental health specialist can help determine which treatment is best for you. Some people see improvement with one type of therapy. However, other people require a combination of therapies. This information is not intended to replace advice given to you by your health care provider. Make sure you discuss any questions you have with your health care provider. Document Revised: 07/07/2019 Document Reviewed: 07/07/2019 Elsevier Patient Education  2021 [employer].

## 2020-12-22 NOTE — Progress Notes (Signed)
Anxiety f/u

## 2020-12-29 ENCOUNTER — Other Ambulatory Visit: Payer: Self-pay

## 2020-12-29 ENCOUNTER — Encounter: Payer: Federal, State, Local not specified - PPO | Admitting: Family

## 2020-12-29 ENCOUNTER — Encounter: Payer: Self-pay | Admitting: Family

## 2020-12-29 ENCOUNTER — Ambulatory Visit (INDEPENDENT_AMBULATORY_CARE_PROVIDER_SITE_OTHER): Payer: Federal, State, Local not specified - PPO | Admitting: Family

## 2020-12-29 VITALS — BP 111/69 | HR 73 | Ht 65.63 in | Wt 173.4 lb

## 2020-12-29 DIAGNOSIS — Z1329 Encounter for screening for other suspected endocrine disorder: Secondary | ICD-10-CM

## 2020-12-29 DIAGNOSIS — Z13228 Encounter for screening for other metabolic disorders: Secondary | ICD-10-CM | POA: Diagnosis not present

## 2020-12-29 DIAGNOSIS — Z130 Encounter for screening for diseases of the blood and blood-forming organs and certain disorders involving the immune mechanism: Secondary | ICD-10-CM | POA: Diagnosis not present

## 2020-12-29 DIAGNOSIS — Z131 Encounter for screening for diabetes mellitus: Secondary | ICD-10-CM

## 2020-12-29 DIAGNOSIS — Z1159 Encounter for screening for other viral diseases: Secondary | ICD-10-CM

## 2020-12-29 DIAGNOSIS — Z114 Encounter for screening for human immunodeficiency virus [HIV]: Secondary | ICD-10-CM

## 2020-12-29 DIAGNOSIS — Z0000 Encounter for general adult medical examination without abnormal findings: Secondary | ICD-10-CM | POA: Diagnosis not present

## 2020-12-29 DIAGNOSIS — Z13220 Encounter for screening for lipoid disorders: Secondary | ICD-10-CM

## 2020-12-29 DIAGNOSIS — Z124 Encounter for screening for malignant neoplasm of cervix: Secondary | ICD-10-CM

## 2020-12-29 NOTE — Patient Instructions (Signed)
 Annual physical exam and labs today.   Follow-up with primary provider as scheduled. Preventive Care 21-24 Years Old, Female Preventive care refers to lifestyle choices and visits with your health care provider that can promote health and wellness. This includes:  A yearly physical exam. This is also called an annual wellness visit.  Regular dental and eye exams.  Immunizations.  Screening for certain conditions.  Healthy lifestyle choices, such as: ? Eating a healthy diet. ? Getting regular exercise. ? Not using drugs or products that contain nicotine and tobacco. ? Limiting alcohol use. What can I expect for my preventive care visit? Physical exam Your health care provider may check your:  Height and weight. These may be used to calculate your BMI (body mass index). BMI is a measurement that tells if you are at a healthy weight.  Heart rate and blood pressure.  Body temperature.  Skin for abnormal spots. Counseling Your health care provider may ask you questions about your:  Past medical problems.  Family's medical history.  Alcohol, tobacco, and drug use.  Emotional well-being.  Home life and relationship well-being.  Sexual activity.  Diet, exercise, and sleep habits.  Work and work environment.  Access to firearms.  Method of birth control.  Menstrual cycle.  Pregnancy history. What immunizations do I need? Vaccines are usually given at various ages, according to a schedule. Your health care provider will recommend vaccines for you based on your age, medical history, and lifestyle or other factors, such as travel or where you work.   What tests do I need? Blood tests  Lipid and cholesterol levels. These may be checked every 5 years starting at age 20.  Hepatitis C test.  Hepatitis B test. Screening  Diabetes screening. This is done by checking your blood sugar (glucose) after you have not eaten for a while (fasting).  STD (sexually  transmitted disease) testing, if you are at risk.  BRCA-related cancer screening. This may be done if you have a family history of breast, ovarian, tubal, or peritoneal cancers.  Pelvic exam and Pap test. This may be done every 3 years starting at age 21. Starting at age 30, this may be done every 5 years if you have a Pap test in combination with an HPV test. Talk with your health care provider about your test results, treatment options, and if necessary, the need for more tests.   Follow these instructions at home: Eating and drinking  Eat a healthy diet that includes fresh fruits and vegetables, whole grains, lean protein, and low-fat dairy products.  Take vitamin and mineral supplements as recommended by your health care provider.  Do not drink alcohol if: ? Your health care provider tells you not to drink. ? You are pregnant, may be pregnant, or are planning to become pregnant.  If you drink alcohol: ? Limit how much you have to 0-1 drink a day. ? Be aware of how much alcohol is in your drink. In the [place] one drink equals one 12 oz bottle of beer (355 mL), one 5 oz glass of wine (148 mL), or one 1 oz glass of hard liquor (44 mL).   Lifestyle  Take daily care of your teeth and gums. Brush your teeth every morning and night with fluoride toothpaste. Floss one time each day.  Stay active. Exercise for at least 30 minutes 5 or more days each week.  Do not use any products that contain nicotine or tobacco, such as cigarettes, e-cigarettes, and   chewing tobacco. If you need help quitting, ask your health care provider.  Do not use drugs.  If you are sexually active, practice safe sex. Use a condom or other form of protection to prevent STIs (sexually transmitted infections).  If you do not wish to become pregnant, use a form of birth control. If you plan to become pregnant, see your health care provider for a prepregnancy visit.  Find healthy ways to cope with stress, such  as: ? Meditation, yoga, or listening to music. ? Journaling. ? Talking to a trusted person. ? Spending time with friends and family. Safety  Always wear your seat belt while driving or riding in a vehicle.  Do not drive: ? If you have been drinking alcohol. Do not ride with someone who has been drinking. ? When you are tired or distracted. ? While texting.  Wear a helmet and other protective equipment during sports activities.  If you have firearms in your house, make sure you follow all gun safety procedures.  Seek help if you have been physically or sexually abused. What's next?  Go to your health care provider once a year for an annual wellness visit.  Ask your health care provider how often you should have your eyes and teeth checked.  Stay up to date on all vaccines. This information is not intended to replace advice given to you by your health care provider. Make sure you discuss any questions you have with your health care provider. Document Revised: 05/14/2020 Document Reviewed: 05/28/2018 Elsevier Patient Education  2021 [employer] Inc[employer]  

## 2020-12-29 NOTE — Progress Notes (Signed)
Physical

## 2020-12-29 NOTE — Progress Notes (Signed)
Patient ID: Ruth Simon, female    DOB: 10/19/1995  MRN: [id number]  CC: Annual Physical Exam  Subjective: Ruth Simon is a 25 y.o. female who presents for annual physical exam. Her concerns today include: none   Patient Active Problem List   Diagnosis Date Noted  . Anxiety 10/29/2018  . Other chest pain 10/29/2018  . Depression 10/29/2018  . Palpitations 10/29/2018  . Costochondritis 10/29/2018     Current Outpatient Medications on File Prior to Visit  Medication Sig Dispense Refill  . citalopram (CELEXA) 20 MG tablet Take 1 tablet (20 mg total) by mouth daily. 30 tablet 2   No current facility-administered medications on file prior to visit.    No Known Allergies  Social History   Socioeconomic History  . Marital status: Single    Spouse name: Not on file  . Number of children: Not on file  . Years of education: Not on file  . Highest education level: Not on file  Occupational History  . Not on file  Tobacco Use  . Smoking status: Never Smoker  . Smokeless tobacco: Never Used  Vaping Use  . Vaping Use: Never used  Substance and Sexual Activity  . Alcohol use: Not Currently    Comment: occasional  . Drug use: Never  . Sexual activity: Yes    Birth control/protection: Implant  Other Topics Concern  . Not on file  Social History Narrative  . Not on file   Social Determinants of Health   Financial Resource Strain: Not on file  Food Insecurity: Not on file  Transportation Needs: Not on file  Physical Activity: Not on file  Stress: Not on file  Social Connections: Not on file  Intimate Partner Violence: Not on file    Family History  Family history unknown: Yes    Past Surgical History:  Procedure Laterality Date  . NO PAST SURGERIES      ROS: Review of Systems Negative except as stated above  PHYSICAL EXAM: BP 111/69 (BP Location: Right Arm, Patient Position: Sitting)   Pulse 73   Ht 5' 5.63" (1.667 m)   Wt 173 lb 6.4 oz (78.7 kg)    SpO2 97%   BMI 28.30 kg/m   Wt Readings from Last 3 Encounters:  12/29/20 173 lb 6.4 oz (78.7 kg)  12/22/20 183 lb 9.6 oz (83.3 kg)  11/24/20 188 lb (85.3 kg)    Physical Exam HENT:     Head: Normocephalic and atraumatic.     Right Ear: Tympanic membrane, ear canal and external ear normal.     Left Ear: Tympanic membrane, ear canal and external ear normal. There is no impacted cerumen.     Nose: Nose normal.     Mouth/Throat:     Mouth: Mucous membranes are moist.     Pharynx: Oropharynx is clear.  Eyes:     Extraocular Movements: Extraocular movements intact.     Conjunctiva/sclera: Conjunctivae normal.     Pupils: Pupils are equal, round, and reactive to light.  Cardiovascular:     Rate and Rhythm: Normal rate and regular rhythm.     Pulses: Normal pulses.     Heart sounds: Normal heart sounds.  Pulmonary:     Effort: Pulmonary effort is normal.     Breath sounds: Normal breath sounds.  Chest:     Comments: Margorie John, CMA present during examination.  Abdominal:     General: Bowel sounds are normal.     Palpations: Abdomen  is soft.  Genitourinary:    Comments: Patient declined examination. Musculoskeletal:        General: Normal range of motion.     Cervical back: Normal range of motion and neck supple.  Skin:    General: Skin is warm and dry.     Capillary Refill: Capillary refill takes less than 2 seconds.  Neurological:     General: No focal deficit present.     Mental Status: She is alert and oriented to person, place, and time.  Psychiatric:        Mood and Affect: Mood normal.        Behavior: Behavior normal.     ASSESSMENT AND PLAN: 1. Annual physical exam: - Counseled on 150 minutes of exercise per week as tolerated, healthy eating (including decreased daily intake of saturated fats, cholesterol, added sugars, sodium), STI prevention, and routine healthcare maintenance.  2. Screening for metabolic disorder: - CMP to check kidney function, liver  function, and electrolyte balance.  - Comprehensive metabolic panel  3. Screening for deficiency anemia: - CBC to screen for anemia. - CBC  4. Diabetes mellitus screening: - Hemoglobin A1c to screen for pre-diabetes/diabetes. - Hemoglobin A1c  5. Screening cholesterol level: - Lipid panel to screen for high cholesterol.  - Lipid panel  6. Thyroid disorder screen: - TSH to check thyroid function.  - TSH+T4F+T3Free  7. Need for hepatitis C screening test: - Hepatitis C antibody to screen for hepatitis C.  - Hepatitis C Antibody  8. Encounter for screening for HIV: - HIV antibody to screen for human immunodeficiency virus.  - HIV antibody (with reflex)  9. Pap smear for cervical cancer screening: - Referral to Obstetrics / Gynecology for cervical cancer screening by PAP smear.  - Ambulatory referral to Gynecology   Patient was given the opportunity to ask questions.  Patient verbalized understanding of the plan and was able to repeat key elements of the plan. Patient was given clear instructions to go to Emergency Department or return to medical center if symptoms don't improve, worsen, or new problems develop.The patient verbalized understanding.   Orders Placed This Encounter  Procedures  . Hepatitis C Antibody  . HIV antibody (with reflex)  . CBC  . Comprehensive metabolic panel  . Lipid panel  . TSH+T4F+T3Free  . Hemoglobin A1c  . Ambulatory referral to Gynecology     Requested Prescriptions    No prescriptions requested or ordered in this encounter   Follow-up with primary provider as scheduled.   Rema Fendt, NP

## 2020-12-30 LAB — COMPREHENSIVE METABOLIC PANEL
ALT: 15 IU/L (ref 0–32)
AST: 19 IU/L (ref 0–40)
Albumin/Globulin Ratio: 1.4 (ref 1.2–2.2)
Albumin: 4.2 g/dL (ref 3.9–5.0)
Alkaline Phosphatase: 94 IU/L (ref 44–121)
BUN/Creatinine Ratio: 10 (ref 9–23)
BUN: 10 mg/dL (ref 6–20)
Bilirubin Total: 0.3 mg/dL (ref 0.0–1.2)
CO2: 22 mmol/L (ref 20–29)
Calcium: 9.3 mg/dL (ref 8.7–10.2)
Chloride: 104 mmol/L (ref 96–106)
Creatinine, Ser: 1.01 mg/dL — ABNORMAL HIGH (ref 0.57–1.00)
Globulin, Total: 3.1 g/dL (ref 1.5–4.5)
Glucose: 91 mg/dL (ref 65–99)
Potassium: 4.1 mmol/L (ref 3.5–5.2)
Sodium: 139 mmol/L (ref 134–144)
Total Protein: 7.3 g/dL (ref 6.0–8.5)
eGFR: 80 mL/min/{1.73_m2} (ref >59)

## 2020-12-30 LAB — LIPID PANEL
Chol/HDL Ratio: 3.1 ratio (ref 0.0–4.4)
Cholesterol, Total: 160 mg/dL (ref 100–199)
HDL: 51 mg/dL (ref >39)
LDL Chol Calc (NIH): 96 mg/dL (ref 0–99)
Triglycerides: 65 mg/dL (ref 0–149)
VLDL Cholesterol Cal: 13 mg/dL (ref 5–40)

## 2020-12-30 LAB — CBC
Hematocrit: 43.6 % (ref 34.0–46.6)
Hemoglobin: 14.4 g/dL (ref 11.1–15.9)
MCH: 30.1 pg (ref 26.6–33.0)
MCHC: 33 g/dL (ref 31.5–35.7)
MCV: 91 fL (ref 79–97)
Platelets: 238 10*3/uL (ref 150–450)
RBC: 4.79 x10E6/uL (ref 3.77–5.28)
RDW: 13.3 % (ref 11.7–15.4)
WBC: 5.6 10*3/uL (ref 3.4–10.8)

## 2020-12-30 LAB — TSH+T4F+T3FREE
Free T4: 1.06 ng/dL (ref 0.82–1.77)
T3, Free: 3.1 pg/mL (ref 2.0–4.4)
TSH: 2.44 u[IU]/mL (ref 0.450–4.500)

## 2020-12-30 LAB — HEMOGLOBIN A1C
Est. average glucose Bld gHb Est-mCnc: 108 mg/dL
Hgb A1c MFr Bld: 5.4 % (ref 4.8–5.6)

## 2020-12-30 LAB — HEPATITIS C ANTIBODY: Hep C Virus Ab: <0.1 s/co ratio (ref 0.0–0.9)

## 2020-12-30 LAB — HIV ANTIBODY (ROUTINE TESTING W REFLEX): HIV Screen 4th Generation wRfx: NONREACTIVE

## 2020-12-31 NOTE — Progress Notes (Signed)
Kidney function normal.   Liver function normal.   Thyroid function normal.   Cholesterol normal.   No diabetes.   HIV negative.   Hepatitis C negative.

## 2021-01-16 ENCOUNTER — Other Ambulatory Visit: Payer: Self-pay | Admitting: Family

## 2021-01-16 DIAGNOSIS — F419 Anxiety disorder, unspecified: Secondary | ICD-10-CM

## 2021-01-16 DIAGNOSIS — F32A Depression, unspecified: Secondary | ICD-10-CM

## 2021-03-23 ENCOUNTER — Telehealth: Payer: Federal, State, Local not specified - PPO | Admitting: Family

## 2021-03-27 ENCOUNTER — Other Ambulatory Visit: Payer: Self-pay

## 2021-03-27 ENCOUNTER — Encounter: Payer: Self-pay | Admitting: Family

## 2021-03-27 ENCOUNTER — Telehealth (INDEPENDENT_AMBULATORY_CARE_PROVIDER_SITE_OTHER): Payer: BLUE CROSS/BLUE SHIELD | Admitting: Family

## 2021-03-27 DIAGNOSIS — F419 Anxiety disorder, unspecified: Secondary | ICD-10-CM | POA: Diagnosis not present

## 2021-03-27 DIAGNOSIS — F32A Depression, unspecified: Secondary | ICD-10-CM | POA: Diagnosis not present

## 2021-03-27 MED ORDER — CITALOPRAM HYDROBROMIDE 20 MG PO TABS: 20.0000 mg | ORAL_TABLET | Freq: Every day | ORAL | 2 refills | Status: DC

## 2021-03-27 NOTE — Progress Notes (Signed)
Pt presents for telemedicine visit for depression/anxiety f/u Pt states the medication is working and she is doing great

## 2021-03-27 NOTE — Progress Notes (Signed)
Virtual Visit via Telephone Note  I connected with Ruth Simon, on 03/27/2021 at 4:04 PM by telephone due to the COVID-19 pandemic and verified that I am speaking with the correct person using two identifiers.  Due to current restrictions/limitations of in-office visits due to the COVID-19 pandemic, this scheduled clinical appointment was converted to a telehealth visit.   Consent: I discussed the limitations, risks, security and privacy concerns of performing an evaluation and management service by telephone and the availability of in person appointments. I also discussed with the patient that there may be a patient responsible charge related to this service. The patient expressed understanding and agreed to proceed.   Location of Patient: Home  Location of Provider: [facility]   Persons participating in Telemedicine visit: Zhoe Catania Ricky Stabs, NP Margorie John, CMA   History of Present Illness: Ruth Simon is a    ANXIETY AND DEPRESSION FOLLOW-UP: 12/22/2020: - Stable. - Denies thoughts of self-harm, suicidal ideations, and homicidal ideations. - Continue Citalopram as prescribed. - Follow-up with primary provider in 3 months or sooner if needed.   03/27/2021: Doing well on current regimen. No side effects. No issues/concerns.   Depression screen [facility] 2/9 03/27/2021 12/29/2020 12/22/2020 11/24/2020 10/29/2018  Decreased Interest 0 0 1 2 0  Down, Depressed, Hopeless 0 0 0 3 1  PHQ - 2 Score 0 0 1 5 1   Altered sleeping 0 1 1 2  -  Tired, decreased energy 0 2 0 3 -  Change in appetite 0 2 1 3  -  Feeling bad or failure about yourself  0 0 0 3 -  Trouble concentrating 0 1 1 3  -  Moving slowly or fidgety/restless 0 3 1 3  -  Suicidal thoughts 0 0 0 2 -  PHQ-9 Score 0 9 5 24  -  Difficult doing work/chores Not difficult at all Somewhat difficult - - -    Past Medical History:  Diagnosis Date   Anemia    [employer]reesia 11/21/2020   Anxiety     Depression    Depression    [employer]reesia 11/21/2020   No Known Allergies  Current Outpatient Medications on File Prior to Visit  Medication Sig Dispense Refill   citalopram (CELEXA) 20 MG tablet Take 1 tablet (20 mg total) by mouth daily. 30 tablet 2   No current facility-administered medications on file prior to visit.    Observations/Objective: Alert and oriented x 3. Not in acute distress. Physical examination not completed as this is a telemedicine visit.  Assessment and Plan: 1. Anxiety: 2. Depression, unspecified depression type: - Patient doing well on current regimen. Patient denies thoughts of self-harm, suicidal ideations, and homicidal ideations.  - Continue Citalopram as prescribed.  - Follow-up with primary provider in 3 months or sooner if needed.  - citalopram (CELEXA) 20 MG tablet; Take 1 tablet (20 mg total) by mouth daily.  Dispense: 30 tablet; Refill: 2   Follow Up Instructions: Follow-up with primary provider in 3 months or sooner if needed.    Patient was given clear instructions to go to Emergency Department or return to medical center if symptoms don't improve, worsen, or new problems develop.The patient verbalized understanding.  I discussed the assessment and treatment plan with the patient. The patient was provided an opportunity to ask questions and all were answered. The patient agreed with the plan and demonstrated an understanding of the instructions.   The patient was advised to call back or seek an in-person  evaluation if the symptoms worsen or if the condition fails to improve as anticipated.   I provided 5 minutes total of non-face-to-face time during this encounter.   Rema Fendt, NP  [facility] at [facility] [place], [place] [phone] 03/27/2021, 4:04 PM

## 2021-07-24 ENCOUNTER — Ambulatory Visit
Admission: EM | Admit: 2021-07-24 | Discharge: 2021-07-24 | Disposition: A | Discharge status: Home / Self Care | Payer: Federal, State, Local not specified - PPO | Attending: Physician Assistant | Admitting: Physician Assistant

## 2021-07-24 ENCOUNTER — Other Ambulatory Visit: Payer: Self-pay

## 2021-07-24 ENCOUNTER — Encounter: Payer: Self-pay | Admitting: *Deleted

## 2021-07-24 DIAGNOSIS — R0789 Other chest pain: Secondary | ICD-10-CM | POA: Diagnosis not present

## 2021-07-24 NOTE — Discharge Instructions (Signed)
  Please follow up with PCP as soon as possible.  

## 2021-07-24 NOTE — ED Triage Notes (Addendum)
PT denies any pain during triage. Pt reports when ever she yawns she feels like she is going to faint for over a week. Pt reports having pain that is dull between her shoulder blades and her thighs feel cold.

## 2021-07-24 NOTE — ED Provider Notes (Signed)
[facility]    CSN: [id number] Arrival date & time: 07/24/21  1943      History   Chief Complaint Chief Complaint  Patient presents with   Chest Pain    HPI Ruth Simon is a 25 y.o. female.   Patient here today for evaluation of intermittent chest pain that she has had for several years.  She reports she is seen multiple factors and no one has given a straightforward answer for cause.  She does admit to having increased stress at work and has been working many hours a week.  She is requesting a note for "rest".  She is taking citalopram for anxiety but states she continues to feel some stress.  She does not report any shortness of breath.  She does report some pain now between her shoulder blades.  The history is provided by the patient.  Chest Pain Associated symptoms: no abdominal pain, no fever, no nausea, no shortness of breath and no vomiting    Past Medical History:  Diagnosis Date   Anemia    Phreesia 11/21/2020   Anxiety    Depression    Depression    Phreesia 11/21/2020    Patient Active Problem List   Diagnosis Date Noted   Anxiety 10/29/2018   Other chest pain 10/29/2018   Depression 10/29/2018   Palpitations 10/29/2018   Costochondritis 10/29/2018    Past Surgical History:  Procedure Laterality Date   NO PAST SURGERIES      OB History   No obstetric history on file.      Home Medications    Prior to Admission medications   Medication Sig Start Date End Date Taking? Authorizing Provider  citalopram (CELEXA) 20 MG tablet Take 1 tablet (20 mg total) by mouth daily. 03/27/21 06/25/21  Rema Fendt, NP    Family History Family History  Family history unknown: Yes    Social History Social History   Tobacco Use   Smoking status: Never   Smokeless tobacco: Never  Vaping Use   Vaping Use: Never used  Substance Use Topics   Alcohol use: Not Currently    Comment: occasional   Drug use: Never     Allergies   Patient has  no known allergies.   Review of Systems Review of Systems  Constitutional:  Negative for activity change, chills and fever.  Eyes:  Negative for discharge and redness.  Respiratory:  Negative for shortness of breath.   Cardiovascular:  Positive for chest pain.  Gastrointestinal:  Negative for abdominal pain, nausea and vomiting.  Genitourinary:  Positive for vaginal bleeding and vaginal discharge.    Physical Exam Triage Vital Signs ED Triage Vitals  Enc Vitals Group     BP 07/24/21 1950 124/88     Pulse Rate 07/24/21 1950 94     Resp 07/24/21 1950 20     Temp 07/24/21 1950 98.6 F (37 C)     Temp src --      SpO2 07/24/21 1950 98 %     Weight --      Height --      Head Circumference --      Peak Flow --      Pain Score 07/24/21 1951 0     Pain Loc --      Pain Edu? --      Excl. in GC? --    No data found.  Updated Vital Signs BP 124/88   Pulse 94   Temp  98.6 F (37 C)   Resp 20   LMP 07/17/2021   SpO2 98%     Physical Exam Vitals and nursing note reviewed.  Constitutional:      General: She is not in acute distress.    Appearance: Normal appearance. She is not ill-appearing.  HENT:     Head: Normocephalic and atraumatic.  Eyes:     Conjunctiva/sclera: Conjunctivae normal.  Cardiovascular:     Rate and Rhythm: Normal rate and regular rhythm.     Heart sounds: Normal heart sounds. No murmur heard. Pulmonary:     Effort: Pulmonary effort is normal. No respiratory distress.     Breath sounds: Normal breath sounds. No wheezing, rhonchi or rales.  Neurological:     Mental Status: She is alert.  Psychiatric:        Mood and Affect: Mood normal.        Behavior: Behavior normal.        Thought Content: Thought content normal.     UC Treatments / Results  Labs (all labs ordered are listed, but only abnormal results are displayed) Labs Reviewed - No data to display  EKG   Radiology No results found.  Procedures Procedures (including critical  care time)  Medications Ordered in UC Medications - No data to display  Initial Impression / Assessment and Plan / UC Course  I have reviewed the triage vital signs and the nursing notes.  Pertinent labs & imaging results that were available during my care of the patient were reviewed by me and considered in my medical decision making (see chart for details).  EKG without any concerning findings and discussed options for further evaluation in the emergency department tonight versus follow-up with her PCP.  Patient feels stable at this point and would like to follow-up with her PCP.  I feel this is very reasonable but did encourage her to report to ED if symptoms worsen anyway.  I suspect some of her symptoms are related to anxiety and stress and I did give her a work note for the next 2 days off work.  I encouraged her to follow-up if she has any further concerns.  Final Clinical Impressions(s) / UC Diagnoses   Final diagnoses:  Chest discomfort     Discharge Instructions      Please follow up with PCP as soon as possible.      ED Prescriptions   None    PDMP not reviewed this encounter.   Tomi Bamberger, PA-C 07/24/21 2021

## 2021-09-05 ENCOUNTER — Telehealth: Payer: Self-pay

## 2021-09-05 NOTE — Telephone Encounter (Signed)
Pt sent Mychart appointment request for possible sprained wrist, attempt to contact no ans unable to lvm mailbox full

## 2021-10-05 ENCOUNTER — Other Ambulatory Visit: Payer: Self-pay

## 2021-10-05 DIAGNOSIS — F419 Anxiety disorder, unspecified: Secondary | ICD-10-CM

## 2021-10-05 DIAGNOSIS — F32A Depression, unspecified: Secondary | ICD-10-CM

## 2021-10-05 MED ORDER — CITALOPRAM HYDROBROMIDE 20 MG PO TABS: 20.0000 mg | ORAL_TABLET | Freq: Every day | ORAL | 2 refills | Status: DC

## 2021-11-20 ENCOUNTER — Ambulatory Visit (INDEPENDENT_AMBULATORY_CARE_PROVIDER_SITE_OTHER): Payer: BLUE CROSS/BLUE SHIELD | Admitting: Obstetrics and Gynecology

## 2021-11-20 ENCOUNTER — Other Ambulatory Visit: Payer: Self-pay

## 2021-11-20 ENCOUNTER — Encounter: Payer: Self-pay | Admitting: Obstetrics and Gynecology

## 2021-11-20 ENCOUNTER — Other Ambulatory Visit (HOSPITAL_COMMUNITY)
Admission: RE | Admit: 2021-11-20 | Discharge: 2021-11-20 | Disposition: A | Discharge status: Home / Self Care | Payer: Federal, State, Local not specified - PPO | Source: Ambulatory Visit | Attending: Obstetrics and Gynecology | Admitting: Obstetrics and Gynecology

## 2021-11-20 VITALS — BP 113/75 | HR 89 | Ht 65.0 in | Wt 178.8 lb

## 2021-11-20 DIAGNOSIS — Z01419 Encounter for gynecological examination (general) (routine) without abnormal findings: Secondary | ICD-10-CM | POA: Diagnosis present

## 2021-11-20 NOTE — Progress Notes (Signed)
New patient presents to establish care. Has never had pap smear. Denies vaginal or urinary symptoms today. Desires STD screening and blood work. Has Nexplanon placed 2021 per patient, reports no issues with it.

## 2021-11-20 NOTE — Progress Notes (Signed)
Subjective:     Ruth Simon is a 26 y.o. female P0 with LMP 11/13/21 and BMI 29 who is here for a comprehensive physical exam. The patient reports no problems. She is sexually active using Nexplanon for contraception. She reports a monthly period of 4-5 days with Nexplanon.  She denies any pelvic pain or abnormal discharge. She is without any complaints. Patient desires STI testing  Past Medical History:  Diagnosis Date   Anemia    Phreesia 11/21/2020   Anxiety    Depression    Depression    Phreesia 11/21/2020   Past Surgical History:  Procedure Laterality Date   NO PAST SURGERIES     Family History  Problem Relation Age of Onset   Evelene Croon Parkinson White syndrome Mother    Mental illness Father    Heart disease Paternal Grandmother      Social History   Socioeconomic History   Marital status: Single    Spouse name: Not on file   Number of children: Not on file   Years of education: Not on file   Highest education level: Not on file  Occupational History   Not on file  Tobacco Use   Smoking status: Never   Smokeless tobacco: Never  Vaping Use   Vaping Use: Never used  Substance and Sexual Activity   Alcohol use: Not Currently    Comment: occasional   Drug use: Never   Sexual activity: Yes    Birth control/protection: Implant  Other Topics Concern   Not on file  Social History Narrative   Not on file   Social Determinants of Health   Financial Resource Strain: Not on file  Food Insecurity: Not on file  Transportation Needs: Not on file  Physical Activity: Not on file  Stress: Not on file  Social Connections: Not on file  Intimate Partner Violence: Not on file   Health Maintenance  Topic Date Due   HPV VACCINES (1 - 2-dose series) Never done   TETANUS/TDAP  Never done   PAP-Cervical Cytology Screening  Never done   PAP SMEAR-Modifier  Never done   INFLUENZA VACCINE  04/30/2021   Hepatitis C Screening  Completed   HIV Screening  Completed        Review of Systems Pertinent items noted in HPI and remainder of comprehensive ROS otherwise negative.   Objective:  Height 5\' 5"  (1.651 m), weight 178 lb 12.8 oz (81.1 kg), last menstrual period 11/13/2021.   GENERAL: Well-developed, well-nourished female in no acute distress.  HEENT: Normocephalic, atraumatic. Sclerae anicteric.  NECK: Supple. Normal thyroid.  LUNGS: Clear to auscultation bilaterally.  HEART: Regular rate and rhythm. BREASTS: Symmetric in size. No palpable masses or lymphadenopathy, skin changes, or nipple drainage. ABDOMEN: Soft, nontender, nondistended. No organomegaly. PELVIC: Normal external female genitalia. Vagina is pink and rugated.  Normal discharge. Normal appearing cervix. Uterus is normal in size. No adnexal mass or tenderness. Chaperone present during the pelvic exam EXTREMITIES: No cyanosis, clubbing, or edema, 2+ distal pulses.     Assessment:    Healthy female exam.      Plan:    Pap smear collected STI screening today Nexplanon due for removal in 2024 Patient will be contacted with abnormal results See After Visit Summary for Counseling Recommendations

## 2021-11-21 LAB — RPR: RPR Ser Ql: NONREACTIVE

## 2021-11-21 LAB — HEPATITIS C ANTIBODY: Hep C Virus Ab: NONREACTIVE

## 2021-11-21 LAB — CERVICOVAGINAL ANCILLARY ONLY
Chlamydia: NEGATIVE
Comment: NEGATIVE
Comment: NEGATIVE
Comment: NORMAL
Neisseria Gonorrhea: NEGATIVE
Trichomonas: POSITIVE — AB

## 2021-11-21 LAB — HEPATITIS B SURFACE ANTIGEN: Hepatitis B Surface Ag: NEGATIVE

## 2021-11-21 LAB — HIV ANTIBODY (ROUTINE TESTING W REFLEX): HIV Screen 4th Generation wRfx: NONREACTIVE

## 2021-11-22 ENCOUNTER — Encounter: Payer: Self-pay | Admitting: *Deleted

## 2021-11-22 MED ORDER — METRONIDAZOLE 500 MG PO TABS: 500.0000 mg | ORAL_TABLET | Freq: Two times a day (BID) | ORAL | 0 refills | Status: DC

## 2021-11-22 NOTE — Progress Notes (Signed)
Returned pt TC to discuss results. No answer. HIPPA compliant VM left.

## 2021-11-22 NOTE — Addendum Note (Signed)
Addended by: Catalina Antigua on: 11/22/2021 10:02 AM   Modules accepted: Orders

## 2021-11-22 NOTE — Progress Notes (Signed)
TC to patient to inform of Trich and RX Flagyl. No answer. Left HIPPA compliant VM. MyChart message sent including diagnosis, treatment info and need for partner to be treated and abstinence X 7 days following treatment. Education on Trichomonas included in message.

## 2021-11-23 LAB — CYTOLOGY - PAP: Diagnosis: NEGATIVE

## 2022-02-01 ENCOUNTER — Ambulatory Visit: Payer: BLUE CROSS/BLUE SHIELD | Admitting: Physician Assistant

## 2022-04-08 ENCOUNTER — Encounter: Payer: Self-pay | Admitting: Family

## 2022-04-08 ENCOUNTER — Encounter: Payer: Self-pay | Admitting: Obstetrics and Gynecology

## 2022-10-30 ENCOUNTER — Telehealth: Payer: Self-pay | Admitting: Family

## 2022-10-30 NOTE — Telephone Encounter (Signed)
Returned pt's call to schedule appt for Physical, no answer (straight to voicemail) "Mailbox is full - cannot Leave a voice message"    Pt's last CPE 12/29/2020 w/ PCP Amy Minette Brine.

## 2022-11-15 NOTE — Progress Notes (Deleted)
Patient ID: Ruth Simon, female    DOB: June 19, 1996  MRN: [id number]  CC: Annual Physical Exam  Subjective: Ruth Simon is a 27 y.o. female who presents for annual physical exam.   Her concerns today include:    Patient Active Problem List   Diagnosis Date Noted   Anxiety 10/29/2018   Other chest pain 10/29/2018   Depression 10/29/2018   Palpitations 10/29/2018   Costochondritis 10/29/2018     Current Outpatient Medications on File Prior to Visit  Medication Sig Dispense Refill   citalopram (CELEXA) 20 MG tablet Take 1 tablet (20 mg total) by mouth daily. 30 tablet 2   etonogestrel (NEXPLANON) 68 MG IMPL implant 1 each by Subdermal route once. 2021     metroNIDAZOLE (FLAGYL) 500 MG tablet Take 1 tablet (500 mg total) by mouth 2 (two) times daily. 14 tablet 0   No current facility-administered medications on file prior to visit.    No Known Allergies  Social History   Socioeconomic History   Marital status: Single    Spouse name: Not on file   Number of children: Not on file   Years of education: Not on file   Highest education level: Not on file  Occupational History   Not on file  Tobacco Use   Smoking status: Never   Smokeless tobacco: Never  Vaping Use   Vaping Use: Never used  Substance and Sexual Activity   Alcohol use: Not Currently    Comment: occasional   Drug use: Never   Sexual activity: Yes    Partners: Male    Birth control/protection: Implant  Other Topics Concern   Not on file  Social History Narrative   Not on file   Social Determinants of Health   Financial Resource Strain: Low Risk  (10/29/2018)   Overall Financial Resource Strain (CARDIA)    Difficulty of Paying Living Expenses: Not hard at all  Food Insecurity: No Food Insecurity (10/29/2018)   Hunger Vital Sign    Worried About Running Out of Food in the Last Year: Never true    [place] in the Last Year: Never true  Transportation Needs: No Transportation Needs  (10/29/2018)   PRAPARE - Hydrologist (Medical): No    Lack of Transportation (Non-Medical): No  Physical Activity: Not on file  Stress: Not on file  Social Connections: Not on file  Intimate Partner Violence: Not on file    Family History  Problem Relation Age of Onset   Yves Dill Parkinson White syndrome Mother    Mental illness Father    Heart disease Paternal Grandmother     Past Surgical History:  Procedure Laterality Date   NO PAST SURGERIES      ROS: Review of Systems Negative except as stated above  PHYSICAL EXAM: There were no vitals taken for this visit.  Physical Exam  {female adult master:310786} {female adult master:310785}     Latest Ref Rng & Units 12/29/2020    9:18 AM 10/29/2018    9:47 AM 08/12/2018    9:43 PM  CMP  Glucose 65 - 99 mg/dL 91  87  82   BUN 6 - 20 mg/dL 10  7  12   $ Creatinine 0.57 - 1.00 mg/dL 1.01  0.96  0.97   Sodium 134 - 144 mmol/L 139  138  140   Potassium 3.5 - 5.2 mmol/L 4.1  4.3  3.7   Chloride 96 - 106 mmol/L  104  102  111   CO2 20 - 29 mmol/L 22  22  25   $ Calcium 8.7 - 10.2 mg/dL 9.3  9.1  9.1   Total Protein 6.0 - 8.5 g/dL 7.3  6.9    Total Bilirubin 0.0 - 1.2 mg/dL 0.3  0.3    Alkaline Phos 44 - 121 IU/L 94  71    AST 0 - 40 IU/L 19  19    ALT 0 - 32 IU/L 15  19     Lipid Panel     Component Value Date/Time   CHOL 160 12/29/2020 0918   TRIG 65 12/29/2020 0918   HDL 51 12/29/2020 0918   CHOLHDL 3.1 12/29/2020 0918   LDLCALC 96 12/29/2020 0918    CBC    Component Value Date/Time   WBC 5.6 12/29/2020 0918   WBC 8.1 08/12/2018 2143   RBC 4.79 12/29/2020 0918   RBC 4.52 08/12/2018 2143   HGB 14.4 12/29/2020 0918   HCT 43.6 12/29/2020 0918   PLT 238 12/29/2020 0918   MCV 91 12/29/2020 0918   MCH 30.1 12/29/2020 0918   MCH 30.1 08/12/2018 2143   MCHC 33.0 12/29/2020 0918   MCHC 32.1 08/12/2018 2143   RDW 13.3 12/29/2020 0918    ASSESSMENT AND PLAN:  There are no diagnoses linked  to this encounter.   Patient was given the opportunity to ask questions.  Patient verbalized understanding of the plan and was able to repeat key elements of the plan. Patient was given clear instructions to go to Emergency Department or return to medical center if symptoms don't improve, worsen, or new problems develop.The patient verbalized understanding.   No orders of the defined types were placed in this encounter.    Requested Prescriptions    No prescriptions requested or ordered in this encounter    No follow-ups on file.  Camillia Herter, NP

## 2022-11-19 ENCOUNTER — Encounter: Payer: BLUE CROSS/BLUE SHIELD | Admitting: Family

## 2022-11-19 DIAGNOSIS — Z131 Encounter for screening for diabetes mellitus: Secondary | ICD-10-CM

## 2022-11-19 DIAGNOSIS — Z13220 Encounter for screening for lipoid disorders: Secondary | ICD-10-CM

## 2022-11-19 DIAGNOSIS — Z0000 Encounter for general adult medical examination without abnormal findings: Secondary | ICD-10-CM

## 2022-11-19 DIAGNOSIS — Z130 Encounter for screening for diseases of the blood and blood-forming organs and certain disorders involving the immune mechanism: Secondary | ICD-10-CM

## 2022-11-19 DIAGNOSIS — Z13228 Encounter for screening for other metabolic disorders: Secondary | ICD-10-CM

## 2022-11-19 DIAGNOSIS — Z1329 Encounter for screening for other suspected endocrine disorder: Secondary | ICD-10-CM

## 2023-01-03 ENCOUNTER — Encounter: Payer: Self-pay | Admitting: Family Medicine

## 2023-01-03 ENCOUNTER — Ambulatory Visit (INDEPENDENT_AMBULATORY_CARE_PROVIDER_SITE_OTHER): Payer: Federal, State, Local not specified - PPO | Admitting: Family Medicine

## 2023-01-03 VITALS — BP 114/77 | HR 83 | Temp 98.0°F | Ht 65.0 in | Wt 182.0 lb

## 2023-01-03 DIAGNOSIS — F32A Depression, unspecified: Secondary | ICD-10-CM | POA: Diagnosis not present

## 2023-01-03 DIAGNOSIS — Z23 Encounter for immunization: Secondary | ICD-10-CM

## 2023-01-03 DIAGNOSIS — F419 Anxiety disorder, unspecified: Secondary | ICD-10-CM

## 2023-01-03 DIAGNOSIS — Z0000 Encounter for general adult medical examination without abnormal findings: Secondary | ICD-10-CM

## 2023-01-03 MED ORDER — CITALOPRAM HYDROBROMIDE 20 MG PO TABS
20.0000 mg | ORAL_TABLET | Freq: Every day | ORAL | 0 refills | Status: DC
Start: 2023-01-03 — End: 2023-01-17

## 2023-01-03 NOTE — Patient Instructions (Addendum)
Call back to schedule a nurse visit if you need labs.   Bring paperwork in 2 weeks.

## 2023-01-03 NOTE — Progress Notes (Signed)
Complete physical exam  Patient: Ruth Simon   DOB: 10/12/1995   27 y.o. Female  MRN: [id number]  Subjective:    Chief Complaint  Patient presents with   Establish Care   Annual Exam    For school. She says her boyfriend went to go get her paperwork she forgot.     Ruth Simon is a 27 y.o. female who presents today for a complete physical exam. She reports consuming a general diet. Gym/ health club routine includes bootcamp class. She generally feels well. She reports sleeping fairly well. She does have additional problems to discuss today.   Depression and anxiety. She reports she was on treatment in the past, went off to try "natural therapy" this is not working.  Interested in going back on citalopram today. Denies thoughts of self-harm.   Ruth Simon is establishing care with this practice. She is new to me. 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 She needs immunizations today. She does not have paperwork with her. She will make nurse visit if she needs lab work and bring paperwork to office with her 2 week follow-up.    Most recent fall risk assessment:    01/03/2023   12:02 PM  Fall Risk   Falls in the past year? 0  Number falls in past yr: 0  Injury with Fall? 0  Risk for fall due to : No Fall Risks  Follow up Falls evaluation completed     Most recent depression screenings:    01/03/2023   12:02 PM 11/20/2021    9:35 AM  PHQ 2/9 Scores  PHQ - 2 Score 2 3  PHQ- 9 Score 14 4  Denies thoughts of suicide. Wants to go back on treatment today.    Vision:Not within last year  and Dental: No current dental problems and No regular dental care     Patient Care Team: Novella Olive, FNP as PCP - General (Family Medicine)   Outpatient Medications Prior to Visit  Medication Sig   etonogestrel (NEXPLANON) 68 MG IMPL implant 1 each by Subdermal route once. 2021   metroNIDAZOLE (FLAGYL) 500 MG tablet Take 1 tablet (500 mg total) by mouth 2 (two) times daily.    [DISCONTINUED] citalopram (CELEXA) 20 MG tablet Take 1 tablet (20 mg total) by mouth daily. (Patient not taking: Reported on 01/03/2023)   No facility-administered medications prior to visit.    Review of Systems  Constitutional:  Negative for chills and fever.  Eyes:  Negative for blurred vision and double vision.  Respiratory:  Negative for shortness of breath.   Cardiovascular:  Negative for chest pain.  Gastrointestinal:  Negative for abdominal pain, nausea and vomiting.  Neurological:  Negative for dizziness and headaches.  Psychiatric/Behavioral:  Negative for depression and suicidal ideas. The patient is nervous/anxious.           Objective:     BP 114/77   Pulse 83   Temp 98 F (36.7 C) (Oral)   Ht 5\' 5"  (1.651 m)   Wt 182 lb (82.6 kg)   LMP 12/06/2022 (Exact Date)   SpO2 100%   BMI 30.29 kg/m  BP Readings from Last 3 Encounters:  01/03/23 114/77  11/20/21 113/75  07/24/21 124/88      Physical Exam Vitals and nursing note reviewed.  Constitutional:      General: She is not in acute distress.    Appearance: Normal appearance.  HENT:     Right  Ear: Tympanic membrane normal.     Left Ear: Tympanic membrane normal.     Mouth/Throat:     Mouth: Mucous membranes are moist.     Pharynx: Oropharynx is clear. Posterior oropharyngeal erythema present.  Eyes:     Extraocular Movements: Extraocular movements intact.  Cardiovascular:     Rate and Rhythm: Normal rate and regular rhythm.     Pulses: Normal pulses.     Heart sounds: Normal heart sounds.  Pulmonary:     Effort: Pulmonary effort is normal.     Breath sounds: Normal breath sounds.  Abdominal:     General: Bowel sounds are normal.     Palpations: Abdomen is soft.     Tenderness: There is no abdominal tenderness.  Musculoskeletal:        General: Normal range of motion.  Skin:    General: Skin is warm and dry.     Capillary Refill: Capillary refill takes less than 2 seconds.  Neurological:      General: No focal deficit present.     Mental Status: She is alert. Mental status is at baseline.  Psychiatric:        Mood and Affect: Mood normal.        Behavior: Behavior normal.        Thought Content: Thought content normal.        Judgment: Judgment normal.      No results found for any visits on 01/03/23.     Assessment & Plan:    Routine Health Maintenance and Physical Exam  Immunization History  Administered Date(s) Administered   COVID-19, mRNA, vaccine(Comirnaty)12 years and older 01/03/2023   Influenza-Unspecified 09/17/2020, 10/31/2020   PFIZER(Purple Top)SARS-COV-2 Vaccination 08/18/2020, 09/25/2020   Tdap 01/03/2023    Health Maintenance  Topic Date Due   HPV VACCINES (1 - 2-dose series) Never done   INFLUENZA VACCINE  05/01/2023   PAP-Cervical Cytology Screening  11/20/2024   PAP SMEAR-Modifier  11/20/2024   DTaP/Tdap/Td (2 - Td or Tdap) 01/02/2033   COVID-19 Vaccine  Completed   Hepatitis C Screening  Completed   HIV Screening  Completed    Discussed health benefits of physical activity, and encouraged her to engage in regular exercise appropriate for her age and condition.  Problem List Items Addressed This Visit     Anxiety and depression  She reports she was on treatment in the past, went off to try "natural therapy" this is not working.  Interested in going back on citalopram today. Denies thoughts of self-harm. Follow-up in 2 weeks.      Relevant Medications   citalopram (CELEXA) 20 MG tablet   Annual physical exam - Primary  She will bring paperwork for [employer] school in 2 weeks and receive meningococcal vaccine at next visit.  If she needs lab work will schedule a nurse visit.  Last pap smear 11/20/21: normal.    Need for Tdap vaccination   Need for COVID-19 vaccine   Relevant Orders   Pfizer Fall 2023 Covid-19 Vaccine 905yrs and older (Completed)  Agrees with plan of care discussed.  Questions answered. She will get meningococcal   immunization at follow-up visit.  Received Tdap and Covid booster today.  She will get updated immunization record once all vaccines are completed.   Return in about 2 weeks (around 01/17/2023) for depression and anxiety .     Novella OliveKaren R Jeanna Giuffre, FNP

## 2023-01-07 ENCOUNTER — Encounter: Payer: Self-pay | Admitting: Obstetrics and Gynecology

## 2023-01-07 ENCOUNTER — Ambulatory Visit: Payer: BLUE CROSS/BLUE SHIELD | Admitting: Obstetrics and Gynecology

## 2023-01-07 NOTE — Progress Notes (Signed)
27 y.o GYN presents for Nexplanon removal and Insertion.

## 2023-01-07 NOTE — Telephone Encounter (Signed)
Patient reports her Nexplanon was inserted 11/2019. We discussed new data that supports Nexplanon is at least as effective as a Cu IUD for 5 years total. Offered removal & reinsertion today vs removal in 2 years/sooner prn. She would prefer to keep her Nexplanon in place. Appointment cancelled.  Harvie Bridge, MD Obstetrician & Gynecologist, [facility] for [employer], [facility]

## 2023-01-17 ENCOUNTER — Ambulatory Visit (INDEPENDENT_AMBULATORY_CARE_PROVIDER_SITE_OTHER): Payer: Federal, State, Local not specified - PPO | Admitting: Family Medicine

## 2023-01-17 ENCOUNTER — Encounter: Payer: Self-pay | Admitting: Family Medicine

## 2023-01-17 VITALS — BP 113/76 | HR 82 | Temp 99.0°F | Resp 20 | Ht 65.0 in | Wt 182.1 lb

## 2023-01-17 DIAGNOSIS — F419 Anxiety disorder, unspecified: Secondary | ICD-10-CM

## 2023-01-17 DIAGNOSIS — F32A Depression, unspecified: Secondary | ICD-10-CM

## 2023-01-17 DIAGNOSIS — Z111 Encounter for screening for respiratory tuberculosis: Secondary | ICD-10-CM | POA: Insufficient documentation

## 2023-01-17 DIAGNOSIS — Z1159 Encounter for screening for other viral diseases: Secondary | ICD-10-CM | POA: Insufficient documentation

## 2023-01-17 MED ORDER — CITALOPRAM HYDROBROMIDE 20 MG PO TABS: 20.0000 mg | ORAL_TABLET | Freq: Every day | ORAL | 1 refills | Status: AC

## 2023-01-17 NOTE — Progress Notes (Signed)
   Established Patient Office Visit  Subjective   Patient ID: Ruth Simon, female    DOB: Sep 09, 1996  Age: 27 y.o. MRN: [id number]  Chief Complaint  Patient presents with   Follow-up    Patient is here for a two week follow up for Depression and Anxiety     HPI Present today for follow-up on depression and anxiety. "I feel a lot better" Taking citalopram 20 mg daily as prescribed. Wants to continue with current dose.  Discussed counseling, will be moving to [place] for [place] school in May. Resources provided for her to call to arrange virtual therapy sessions. Symptoms are well controll. Denies thoughts of self harm.   Has paperwork for [employer] school today. Needs Quantiferon TB test today. Will complete paperwork for pick up early next week at front desk.     Review of Systems  Constitutional:  Negative for chills and fever.  Respiratory:  Negative for shortness of breath.   Cardiovascular:  Negative for chest pain.  Psychiatric/Behavioral:  Negative for depression and suicidal ideas. The patient is nervous/anxious.       Objective:     BP 113/76   Pulse 82   Temp 99 F (37.2 C) (Oral)   Resp 20   Ht  (1.651 m)   Wt 182 lb 1.6 oz (82.6 kg)   LMP 12/06/2022 (Exact Date)   BMI 30.30 kg/m    Physical Exam Vitals and nursing note reviewed.  Constitutional:      Appearance: Normal appearance.  Cardiovascular:     Rate and Rhythm: Regular rhythm.     Heart sounds: Normal heart sounds.  Pulmonary:     Effort: Pulmonary effort is normal.     Breath sounds: Normal breath sounds.  Skin:    General: Skin is warm and dry.     Capillary Refill: Capillary refill takes less than 2 seconds.  Neurological:     General: No focal deficit present.     Mental Status: She is alert. Mental status is at baseline.  Psychiatric:        Mood and Affect: Mood normal.        Behavior: Behavior normal.        Thought Content: Thought content normal.        Judgment: Judgment normal.      No results found for any visits on 01/17/23.    The ASCVD Risk score (Arnett DK, et al., 2019) failed to calculate for the following reasons:   The 2019 ASCVD risk score is only valid for ages 87 to 27    Assessment & Plan:   Problem List Items Addressed This Visit     Anxiety and depression - Primary  Symptoms are well-controlled on current dose. Continue citalopram 20 mg QD. Resources provided to find therapist that will meet virtually. Exercise encouraged for mental health and physical health. Refills sent. She will follow-up when she is town.    Relevant Medications   citalopram (CELEXA) 20 MG tablet   Screening-pulmonary TB  Needs for PA school.    Relevant Orders   QuantiFERON-TB Gold Plus   Agrees with plan of care discussed.  Questions answered. Will complete school paperwork once lab result is back. She will pick up at front desk.   Return if symptoms worsen or fail to improve, for follow-up when you are in town from school. Novella Olive, FNP

## 2023-01-17 NOTE — Patient Instructions (Addendum)
[facility]

## 2023-01-20 LAB — QUANTIFERON-TB GOLD PLUS
QuantiFERON Mitogen Value: 0.51 IU/mL
QuantiFERON Nil Value: 0.06 IU/mL
QuantiFERON TB1 Ag Value: 0.08 IU/mL
QuantiFERON TB2 Ag Value: 0.05 IU/mL
QuantiFERON-TB Gold Plus: UNDETERMINED — AB

## 2023-01-22 ENCOUNTER — Other Ambulatory Visit: Payer: Self-pay | Admitting: Family Medicine

## 2023-01-22 DIAGNOSIS — Z111 Encounter for screening for respiratory tuberculosis: Secondary | ICD-10-CM

## 2023-01-22 NOTE — Progress Notes (Signed)
Needs to be redrawn due to "indeterminate" result.

## 2023-02-03 LAB — QUANTIFERON-TB GOLD PLUS
QuantiFERON Mitogen Value: >10 IU/mL
QuantiFERON Nil Value: 0.02 IU/mL
QuantiFERON TB1 Ag Value: 0.02 IU/mL
QuantiFERON TB2 Ag Value: 0.03 IU/mL
QuantiFERON-TB Gold Plus: NEGATIVE

## 2023-02-17 ENCOUNTER — Ambulatory Visit: Payer: BLUE CROSS/BLUE SHIELD | Admitting: Obstetrics and Gynecology

## 2023-02-20 ENCOUNTER — Encounter: Payer: Self-pay | Admitting: Family Medicine

## 2023-02-21 NOTE — Telephone Encounter (Signed)
Printed out and placed at [employer].

## 2023-02-25 NOTE — Telephone Encounter (Signed)
Pt notified and is in route to pick up results.
